# Patient Record
Sex: Female | Born: 1960 | Race: Black or African American | Hispanic: No | Smoking: Never smoker
Health system: Southern US, Community
[De-identification: ages and names within clinical notes are randomized; demographics above are authoritative.]

## PROBLEM LIST (undated history)

## (undated) DIAGNOSIS — A6000 Herpesviral infection of urogenital system, unspecified: Secondary | ICD-10-CM

## (undated) DIAGNOSIS — B019 Varicella without complication: Secondary | ICD-10-CM

## (undated) HISTORY — DX: Herpesviral infection of urogenital system, unspecified: A60.00

## (undated) HISTORY — DX: Varicella without complication: B01.9

## (undated) HISTORY — PX: GASTRIC BYPASS: SHX52

---

## 1999-08-23 ENCOUNTER — Other Ambulatory Visit: Admission: RE | Admit: 1999-08-23 | Discharge: 1999-08-23 | Payer: Self-pay | Admitting: Family Medicine

## 2000-09-18 ENCOUNTER — Other Ambulatory Visit: Admission: RE | Admit: 2000-09-18 | Discharge: 2000-09-18 | Payer: Self-pay | Admitting: Family Medicine

## 2001-10-02 ENCOUNTER — Other Ambulatory Visit: Admission: RE | Admit: 2001-10-02 | Discharge: 2001-10-02 | Payer: Self-pay | Admitting: Family Medicine

## 2002-03-21 ENCOUNTER — Encounter: Admission: RE | Admit: 2002-03-21 | Discharge: 2002-03-21 | Payer: Self-pay | Admitting: Family Medicine

## 2002-03-21 ENCOUNTER — Encounter: Payer: Self-pay | Admitting: Family Medicine

## 2004-06-09 ENCOUNTER — Ambulatory Visit: Payer: Self-pay | Admitting: Family Medicine

## 2004-06-09 ENCOUNTER — Other Ambulatory Visit: Admission: RE | Admit: 2004-06-09 | Discharge: 2004-06-09 | Payer: Self-pay | Admitting: Family Medicine

## 2004-06-25 ENCOUNTER — Ambulatory Visit: Payer: Self-pay | Admitting: Family Medicine

## 2004-07-02 ENCOUNTER — Encounter: Admission: RE | Admit: 2004-07-02 | Discharge: 2004-07-02 | Payer: Self-pay | Admitting: Family Medicine

## 2004-11-12 ENCOUNTER — Ambulatory Visit: Payer: Self-pay | Admitting: Cardiology

## 2004-11-24 ENCOUNTER — Ambulatory Visit: Payer: Self-pay

## 2004-11-29 ENCOUNTER — Ambulatory Visit: Payer: Self-pay

## 2005-07-11 ENCOUNTER — Ambulatory Visit: Payer: Self-pay | Admitting: Family Medicine

## 2005-11-02 ENCOUNTER — Encounter: Payer: Self-pay | Admitting: Family Medicine

## 2005-11-02 ENCOUNTER — Other Ambulatory Visit: Admission: RE | Admit: 2005-11-02 | Discharge: 2005-11-02 | Payer: Self-pay | Admitting: Family Medicine

## 2005-11-02 ENCOUNTER — Ambulatory Visit: Payer: Self-pay | Admitting: Family Medicine

## 2005-11-02 LAB — CONVERTED CEMR LAB: Pap Smear: NORMAL

## 2006-03-06 ENCOUNTER — Ambulatory Visit: Payer: Self-pay | Admitting: Family Medicine

## 2007-01-18 ENCOUNTER — Encounter: Payer: Self-pay | Admitting: Family Medicine

## 2007-01-18 DIAGNOSIS — E669 Obesity, unspecified: Secondary | ICD-10-CM

## 2007-01-18 DIAGNOSIS — Z8639 Personal history of other endocrine, nutritional and metabolic disease: Secondary | ICD-10-CM

## 2007-01-18 DIAGNOSIS — E049 Nontoxic goiter, unspecified: Secondary | ICD-10-CM | POA: Insufficient documentation

## 2007-01-18 DIAGNOSIS — T7840XA Allergy, unspecified, initial encounter: Secondary | ICD-10-CM | POA: Insufficient documentation

## 2007-01-18 DIAGNOSIS — Z862 Personal history of diseases of the blood and blood-forming organs and certain disorders involving the immune mechanism: Secondary | ICD-10-CM

## 2007-03-13 ENCOUNTER — Ambulatory Visit: Payer: Self-pay | Admitting: Family Medicine

## 2007-03-13 DIAGNOSIS — R5383 Other fatigue: Secondary | ICD-10-CM

## 2007-03-13 DIAGNOSIS — R5381 Other malaise: Secondary | ICD-10-CM

## 2007-03-13 DIAGNOSIS — N912 Amenorrhea, unspecified: Secondary | ICD-10-CM

## 2007-03-13 DIAGNOSIS — R4589 Other symptoms and signs involving emotional state: Secondary | ICD-10-CM

## 2007-03-14 ENCOUNTER — Encounter: Payer: Self-pay | Admitting: Family Medicine

## 2007-03-15 LAB — CONVERTED CEMR LAB
Calcium, Total (PTH): 8.7 mg/dL (ref 8.4–10.5)
PTH: 155.4 pg/mL — ABNORMAL HIGH (ref 14.0–72.0)

## 2007-03-28 ENCOUNTER — Ambulatory Visit: Payer: Self-pay | Admitting: Family Medicine

## 2007-03-28 DIAGNOSIS — E538 Deficiency of other specified B group vitamins: Secondary | ICD-10-CM | POA: Insufficient documentation

## 2007-04-26 ENCOUNTER — Other Ambulatory Visit: Admission: RE | Admit: 2007-04-26 | Discharge: 2007-04-26 | Payer: Self-pay | Admitting: Family Medicine

## 2007-04-26 ENCOUNTER — Ambulatory Visit: Payer: Self-pay | Admitting: Family Medicine

## 2007-04-26 ENCOUNTER — Encounter: Payer: Self-pay | Admitting: Family Medicine

## 2007-04-27 ENCOUNTER — Encounter: Payer: Self-pay | Admitting: Family Medicine

## 2007-04-30 LAB — CONVERTED CEMR LAB
Cholesterol: 177 mg/dL (ref 0–200)
HDL: 63.7 mg/dL (ref >39.0)
LDL Cholesterol: 104 mg/dL — ABNORMAL HIGH (ref 0–99)
Total CHOL/HDL Ratio: 2.8
Triglycerides: 49 mg/dL (ref 0–149)
VLDL: 10 mg/dL (ref 0–40)
hCG, Beta Chain, Quant, S: <0.5 milliintl units/mL

## 2007-05-02 ENCOUNTER — Encounter (INDEPENDENT_AMBULATORY_CARE_PROVIDER_SITE_OTHER): Payer: Self-pay | Admitting: *Deleted

## 2007-05-02 LAB — CONVERTED CEMR LAB: Pap Smear: NORMAL

## 2007-05-28 ENCOUNTER — Ambulatory Visit: Payer: Self-pay | Admitting: Family Medicine

## 2007-07-18 ENCOUNTER — Ambulatory Visit: Payer: Self-pay | Admitting: Family Medicine

## 2010-07-18 LAB — CONVERTED CEMR LAB
Albumin: 3.2 g/dL — ABNORMAL LOW (ref 3.5–5.2)
BUN: 4 mg/dL — ABNORMAL LOW (ref 6–23)
Basophils Absolute: 0 10*3/uL (ref 0.0–0.1)
Basophils Relative: 0.2 % (ref 0.0–1.0)
CO2: 30 meq/L (ref 19–32)
Calcium: 8.6 mg/dL (ref 8.4–10.5)
Chloride: 107 meq/L (ref 96–112)
Creatinine, Ser: 0.7 mg/dL (ref 0.4–1.2)
Eosinophils Absolute: 0 10*3/uL (ref 0.0–0.6)
Eosinophils Relative: 0.7 % (ref 0.0–5.0)
Folate: 13.3 ng/mL
GFR calc Af Amer: 116 mL/min
GFR calc non Af Amer: 96 mL/min
Glucose, Bld: 89 mg/dL (ref 70–99)
HCT: 39.4 % (ref 36.0–46.0)
Hemoglobin: 13.4 g/dL (ref 12.0–15.0)
Lymphocytes Relative: 29.9 % (ref 12.0–46.0)
MCHC: 34 g/dL (ref 30.0–36.0)
MCV: 84.4 fL (ref 78.0–100.0)
Monocytes Absolute: 0.5 10*3/uL (ref 0.2–0.7)
Monocytes Relative: 9.5 % (ref 3.0–11.0)
Neutro Abs: 3.3 10*3/uL (ref 1.4–7.7)
Neutrophils Relative %: 59.7 % (ref 43.0–77.0)
Phosphorus: 3.4 mg/dL (ref 2.3–4.6)
Platelets: 333 10*3/uL (ref 150–400)
Potassium: 3.8 meq/L (ref 3.5–5.1)
RBC: 4.67 M/uL (ref 3.87–5.11)
RDW: 12.6 % (ref 11.5–14.6)
Sodium: 141 meq/L (ref 135–145)
TSH: 4.22 microintl units/mL (ref 0.35–5.50)
Vitamin B-12: 108 pg/mL — ABNORMAL LOW (ref 211–911)
WBC: 5.4 10*3/uL (ref 4.5–10.5)
hCG, Beta Chain, Quant, S: <0.5 milliintl units/mL

## 2014-07-03 ENCOUNTER — Ambulatory Visit (INDEPENDENT_AMBULATORY_CARE_PROVIDER_SITE_OTHER): Payer: BLUE CROSS/BLUE SHIELD | Admitting: Internal Medicine

## 2014-07-03 ENCOUNTER — Encounter: Payer: Self-pay | Admitting: Internal Medicine

## 2014-07-03 ENCOUNTER — Encounter (INDEPENDENT_AMBULATORY_CARE_PROVIDER_SITE_OTHER): Payer: Self-pay

## 2014-07-03 NOTE — Progress Notes (Signed)
HPI  Pt presents to the clinic today to establish care. She used to see Dr. Milinda Antisower here in the past. She has been getting care recently from her gyn Dr. Normand Sloopillard.  Flu: 03/2014 Tetanus: 2007 LMP: post menopausal Pap Smear: 06/2013 Mammogram: 06/2013 Colon Screening: 2015 Vision Screening: as needed Dentist: as needed  Obesity: s/p gastric bypass. She does not adhere to any specific diet or exercise plan.   She has noticed some lower extremity swelling. It seems to be worse at the end of the day. She does not notice any swelling. It is not painful. She does sit in a chair all day at work. She has not tried anything OTC.   Past Medical History  Diagnosis Date  . Chicken pox   . Genital herpes     Current Outpatient Prescriptions  Medication Sig Dispense Refill  . acetaminophen (TYLENOL) 325 MG tablet Take 650 mg by mouth as needed.    . Misc Natural Products (OSTEO BI-FLEX JOINT SHIELD PO) Take 1 capsule by mouth daily.     No current facility-administered medications for this visit.    No Known Allergies  Family History  Problem Relation Age of Onset  . Cancer Father     Prostate  . Cancer Sister     Breast  . Hypertension Maternal Aunt   . Cancer Paternal Aunt     Breast    History   Social History  . Marital Status: Divorced    Spouse Name: N/A    Number of Children: N/A  . Years of Education: N/A   Occupational History  . Not on file.   Social History Main Topics  . Smoking status: Never Smoker   . Smokeless tobacco: Never Used  . Alcohol Use: 0.0 oz/week    0 Not specified per week     Comment: wine--occasional  . Drug Use: Not on file  . Sexual Activity: Not on file   Other Topics Concern  . Not on file   Social History Narrative  . No narrative on file    ROS:  Constitutional: Denies fever, malaise, fatigue, headache or abrupt weight changes.  Respiratory: Denies difficulty breathing, shortness of breath, cough or sputum production.    Cardiovascular: Pt reports swelling in the feet. Denies chest pain, chest tightness, palpitations or swelling in the hands.  Skin: Denies redness, rashes, lesions or ulcercations.  Neurological: Denies dizziness, difficulty with memory, difficulty with speech or problems with balance and coordination.   No other specific complaints in a complete review of systems (except as listed in HPI above).  PE:  BP 122/84 mmHg  Pulse 64  Temp(Src) 97.9 F (36.6 C) (Oral)  Ht 5' 3.88" (1.623 m)  Wt 286 lb (129.729 kg)  BMI 49.25 kg/m2  SpO2 98% Wt Readings from Last 3 Encounters:  07/03/14 286 lb (129.729 kg)  04/26/07 267 lb (121.11 kg)  03/13/07 265 lb (120.203 kg)    General: Appears her stated age, obese in NAD. Cardiovascular: Normal rate and rhythm. S1,S2 noted.  No murmur, rubs or gallops noted. 1+ pitting BLE edema. No carotid bruits noted. Pulmonary/Chest: Normal effort and positive vesicular breath sounds. No respiratory distress. No wheezes, rales or ronchi noted.  Neurological: Alert and oriented.  BMET    Component Value Date/Time   NA 141 03/13/2007 0000   K 3.8 03/13/2007 0000   CL 107 03/13/2007 0000   CO2 30 03/13/2007 0000   GLUCOSE 89 03/13/2007 0000   BUN 4* 03/13/2007  0000   CREATININE 0.7 03/13/2007 0000   CALCIUM 8.7 03/14/2007 0207   CALCIUM 8.6 03/13/2007 0000   GFRNONAA 96 03/13/2007 0000   GFRAA 116 03/13/2007 0000    Lipid Panel     Component Value Date/Time   CHOL 177 04/26/2007 1027   TRIG 49 04/26/2007 1027   HDL 63.7 04/26/2007 1027   CHOLHDL 2.8 CALC 04/26/2007 1027   VLDL 10 04/26/2007 1027   LDLCALC 104* 04/26/2007 1027    CBC    Component Value Date/Time   WBC 5.4 03/13/2007 0000   RBC 4.67 03/13/2007 0000   HGB 13.4 03/13/2007 0000   HCT 39.4 03/13/2007 0000   PLT 333 03/13/2007 0000   MCV 84.4 03/13/2007 0000   MCHC 34.0 03/13/2007 0000   RDW 12.6 03/13/2007 0000   MONOABS 0.5 03/13/2007 0000   EOSABS 0.0 03/13/2007 0000    BASOSABS 0.0 03/13/2007 0000    Hgb A1C No results found for: HGBA1C   Assessment and Plan:  Peripheral Edema:  Likely dependant Advise her to wear some compression stocking during the day Try to keep feet elevated while sitting No need for diuretic at this time  RTC when available for your physical exam

## 2014-07-03 NOTE — Assessment & Plan Note (Signed)
Advised her to work on diet and exercise

## 2014-07-03 NOTE — Progress Notes (Signed)
Pre visit review using our clinic review tool, if applicable. No additional management support is needed unless otherwise documented below in the visit note. 

## 2014-07-03 NOTE — Patient Instructions (Signed)
Peripheral Edema °You have swelling in your legs (peripheral edema). This swelling is due to excess accumulation of salt and water in your body. Edema may be a sign of heart, kidney or liver disease, or a side effect of a medication. It may also be due to problems in the leg veins. Elevating your legs and using special support stockings may be very helpful, if the cause of the swelling is due to poor venous circulation. Avoid long periods of standing, whatever the cause. °Treatment of edema depends on identifying the cause. Chips, pretzels, pickles and other salty foods should be avoided. Restricting salt in your diet is almost always needed. Water pills (diuretics) are often used to remove the excess salt and water from your body via urine. These medicines prevent the kidney from reabsorbing sodium. This increases urine flow. °Diuretic treatment may also result in lowering of potassium levels in your body. Potassium supplements may be needed if you have to use diuretics daily. Daily weights can help you keep track of your progress in clearing your edema. You should call your caregiver for follow up care as recommended. °SEEK IMMEDIATE MEDICAL CARE IF:  °· You have increased swelling, pain, redness, or heat in your legs. °· You develop shortness of breath, especially when lying down. °· You develop chest or abdominal pain, weakness, or fainting. °· You have a fever. °Document Released: 07/14/2004 Document Revised: 08/29/2011 Document Reviewed: 06/24/2009 °ExitCare® Patient Information ©2015 [employer]. This information is not intended to replace advice given to you by your health care provider. Make sure you discuss any questions you have with your health care provider. ° °

## 2014-09-09 ENCOUNTER — Ambulatory Visit (INDEPENDENT_AMBULATORY_CARE_PROVIDER_SITE_OTHER): Payer: BLUE CROSS/BLUE SHIELD | Admitting: Internal Medicine

## 2014-09-09 ENCOUNTER — Encounter: Payer: Self-pay | Admitting: Internal Medicine

## 2014-09-09 ENCOUNTER — Ambulatory Visit
Admission: RE | Admit: 2014-09-09 | Discharge: 2014-09-09 | Disposition: A | Discharge status: Home / Self Care | Payer: BLUE CROSS/BLUE SHIELD | Source: Ambulatory Visit | Attending: Internal Medicine | Admitting: Internal Medicine

## 2014-09-09 ENCOUNTER — Ambulatory Visit (INDEPENDENT_AMBULATORY_CARE_PROVIDER_SITE_OTHER)
Admission: RE | Admit: 2014-09-09 | Discharge: 2014-09-09 | Disposition: A | Discharge status: Home / Self Care | Payer: BLUE CROSS/BLUE SHIELD | Source: Ambulatory Visit | Attending: Internal Medicine | Admitting: Internal Medicine

## 2014-09-09 VITALS — BP 120/84 | HR 69 | Temp 97.8°F | Ht 63.88 in | Wt 286.8 lb

## 2014-09-09 DIAGNOSIS — M25561 Pain in right knee: Secondary | ICD-10-CM

## 2014-09-09 DIAGNOSIS — A6000 Herpesviral infection of urogenital system, unspecified: Secondary | ICD-10-CM | POA: Diagnosis not present

## 2014-09-09 DIAGNOSIS — H6123 Impacted cerumen, bilateral: Secondary | ICD-10-CM

## 2014-09-09 DIAGNOSIS — Z0000 Encounter for general adult medical examination without abnormal findings: Secondary | ICD-10-CM

## 2014-09-09 DIAGNOSIS — D509 Iron deficiency anemia, unspecified: Secondary | ICD-10-CM

## 2014-09-09 DIAGNOSIS — M25562 Pain in left knee: Principal | ICD-10-CM

## 2014-09-09 LAB — CBC
HCT: 40.6 % (ref 36.0–46.0)
Hemoglobin: 13.4 g/dL (ref 12.0–15.0)
MCHC: 33.2 g/dL (ref 30.0–36.0)
MCV: 84.3 fl (ref 78.0–100.0)
Platelets: 318 10*3/uL (ref 150.0–400.0)
RBC: 4.82 Mil/uL (ref 3.87–5.11)
RDW: 14.4 % (ref 11.5–15.5)
WBC: 4 10*3/uL (ref 4.0–10.5)

## 2014-09-09 LAB — COMPREHENSIVE METABOLIC PANEL
ALT: 8 U/L (ref 0–35)
AST: 18 U/L (ref 0–37)
Albumin: 3.5 g/dL (ref 3.5–5.2)
Alkaline Phosphatase: 96 U/L (ref 39–117)
BILIRUBIN TOTAL: 0.4 mg/dL (ref 0.2–1.2)
BUN: 10 mg/dL (ref 6–23)
CALCIUM: 8.9 mg/dL (ref 8.4–10.5)
CHLORIDE: 106 meq/L (ref 96–112)
CO2: 30 meq/L (ref 19–32)
CREATININE: 0.78 mg/dL (ref 0.40–1.20)
GFR: 99.02 mL/min (ref >60.00)
GLUCOSE: 92 mg/dL (ref 70–99)
Potassium: 3.9 mEq/L (ref 3.5–5.1)
Sodium: 140 mEq/L (ref 135–145)
Total Protein: 6.9 g/dL (ref 6.0–8.3)

## 2014-09-09 LAB — LIPID PANEL
Cholesterol: 168 mg/dL (ref 0–200)
HDL: 62 mg/dL (ref >39.00)
LDL Cholesterol: 97 mg/dL (ref 0–99)
NonHDL: 106
Total CHOL/HDL Ratio: 3
Triglycerides: 46 mg/dL (ref 0.0–149.0)
VLDL: 9.2 mg/dL (ref 0.0–40.0)

## 2014-09-09 LAB — TSH: TSH: 3.3 u[IU]/mL (ref 0.35–4.50)

## 2014-09-09 LAB — IRON: Iron: 104 ug/dL (ref 42–145)

## 2014-09-09 LAB — VITAMIN D 25 HYDROXY (VIT D DEFICIENCY, FRACTURES): VITD: 13.63 ng/mL — ABNORMAL LOW (ref 30.00–100.00)

## 2014-09-09 LAB — FERRITIN: FERRITIN: 27.9 ng/mL (ref 10.0–291.0)

## 2014-09-09 MED ORDER — VALACYCLOVIR HCL 500 MG PO TABS: 500.0000 mg | ORAL_TABLET | Freq: Two times a day (BID) | ORAL | Status: DC

## 2014-09-09 NOTE — Patient Instructions (Signed)

## 2014-09-09 NOTE — Progress Notes (Signed)
Pre visit review using our clinic review tool, if applicable. No additional management support is needed unless otherwise documented below in the visit note. 

## 2014-09-09 NOTE — Assessment & Plan Note (Signed)
Will check iron and ferritin levels today Continue OTC iron supplement and stool softener at this time

## 2014-09-09 NOTE — Addendum Note (Signed)
Addended by: Lorre MunroeBAITY, REGINA W on: 09/09/2014 09:54 AM   Modules accepted: Orders, SmartSet

## 2014-09-09 NOTE — Progress Notes (Addendum)
Subjective:    Patient ID: Valerie BillingsLisa F Kimmet, female    DOB: 1960/08/06, [age] y.o.   MRN: [id number]  HPI  Pt presents to the clinic today for her annual exam.  Flu: 03/2014 Tetanus: 2007 LMP: post menopausal Pap Smear: 06/2013 Mammogram: 06/2013 Colon Screening: 2015 Vision Screening: as needed Dentist: as needed  She does report bilateral knee pain. She reports this started about 6 months ago. She reports they feel stiff at times. She has not noticed any swelling. She has noticed some cracking and popping. She denies any injury to the area. She take Tylenol as needed for the pain.  She also reports history of genital herpes. She will occasionally have 1 single lesion that pops up on her labia. She is out of her Valtrex and does request a refill today.  She has a history of iron deficiency anemia. She has not had her iron levels or iron stores checked in a long time. She is taking an iron supplement with a stool softener daily. She has not noticed any s/s of bleeding. She did have a colonoscopy last year, which was normal.  She also reports that the edema in her legs is gone. Review of Systems  Past Medical History  Diagnosis Date  . Chicken pox   . Genital herpes     Current Outpatient Prescriptions  Medication Sig Dispense Refill  . acetaminophen (TYLENOL) 325 MG tablet Take 650 mg by mouth as needed.    . Misc Natural Products (OSTEO BI-FLEX JOINT SHIELD PO) Take 1 capsule by mouth daily.     No current facility-administered medications for this visit.    No Known Allergies  Family History  Problem Relation Age of Onset  . Cancer Father     Prostate  . Cancer Sister     Breast  . Hypertension Maternal Aunt   . Cancer Paternal Aunt     Breast    History   Social History  . Marital Status: Divorced    Spouse Name: N/A  . Number of Children: N/A  . Years of Education: N/A   Occupational History  . Not on file.   Social History Main Topics  . Smoking  status: Never Smoker   . Smokeless tobacco: Never Used  . Alcohol Use: 0.0 oz/week    0 Standard drinks or equivalent per week     Comment: wine--occasional  . Drug Use: No  . Sexual Activity: Not Currently   Other Topics Concern  . Not on file   Social History Narrative  . No narrative on file     Constitutional: Denies fever, malaise, fatigue, headache or abrupt weight changes.  HEENT: Denies eye pain, eye redness, ear pain, ringing in the ears, wax buildup, runny nose, nasal congestion, bloody nose, or sore throat. Respiratory: Denies difficulty breathing, shortness of breath, cough or sputum production.   Cardiovascular: Denies chest pain, chest tightness, palpitations or swelling in the hands or feet.  Gastrointestinal: Denies abdominal pain, bloating, constipation, diarrhea or blood in the stool.  GU: Denies urgency, frequency, pain with urination, burning sensation, blood in urine, odor or discharge. Musculoskeletal: Pt reports knee pain. Denies decrease in range of motion, difficulty with gait, muscle pain or joint swelling.  Skin: Denies redness, rashes, lesions or ulcercations.  Neurological: Denies dizziness, difficulty with memory, difficulty with speech or problems with balance and coordination.  Psych: Denies anxiety, depression, SI/HI.  No other specific complaints in a complete review of systems (except as listed  in HPI above).     Objective:   Physical Exam  BP 120/84 mmHg  Pulse 69  Temp(Src) 97.8 F (36.6 C) (Oral)  Ht 5' 3.88" (1.623 m)  Wt 286 lb 12 oz (130.069 kg)  BMI 49.38 kg/m2  SpO2 97% Wt Readings from Last 3 Encounters:  09/09/14 286 lb 12 oz (130.069 kg)  07/03/14 286 lb (129.729 kg)  04/26/07 267 lb (121.11 kg)    General: Appears her stated age,obese in NAD. Skin: Warm, dry and intact. No rashes, lesions or ulcerations noted. HEENT: Head: normal shape and size; Eyes: sclera white, no icterus, conjunctiva pink, PERRLA and EOMs intact;  Ears: bilateral cerumen impaction; Nose: mucosa pink and moist, septum midline; Throat/Mouth: Teeth present, mucosa pink and moist, no exudate, lesions or ulcerations noted.  Neck: Neck supple, trachea midline. No masses, lumps or thyromegaly present.  Cardiovascular: Normal rate and rhythm. S1,S2 noted.  No murmur, rubs or gallops noted. No JVD or BLE edema. No carotid bruits noted. Pulmonary/Chest: Normal effort and positive vesicular breath sounds. No respiratory distress. No wheezes, rales or ronchi noted.  Abdomen: Soft and nontender. Normal bowel sounds, no bruits noted. No distention or masses noted. Liver, spleen and kidneys non palpable. Musculoskeletal: Normal flexion and extension of bilateral knees. No pain with palpation. No signs of joint swelling. Strength 5/5 BLE. No difficulty with gait.  Neurological: Alert and oriented. Cranial nerves II-XII grossly intact. Coordination normal. Psychiatric: Mood and affect normal. Behavior is normal. Judgment and thought content normal.     BMET    Component Value Date/Time   NA 141 03/13/2007 0000   K 3.8 03/13/2007 0000   CL 107 03/13/2007 0000   CO2 30 03/13/2007 0000   GLUCOSE 89 03/13/2007 0000   BUN 4* 03/13/2007 0000   CREATININE 0.7 03/13/2007 0000   CALCIUM 8.7 03/14/2007 0207   CALCIUM 8.6 03/13/2007 0000   GFRNONAA 96 03/13/2007 0000   GFRAA 116 03/13/2007 0000    Lipid Panel     Component Value Date/Time   CHOL 177 04/26/2007 1027   TRIG 49 04/26/2007 1027   HDL 63.7 04/26/2007 1027   CHOLHDL 2.8 CALC 04/26/2007 1027   VLDL 10 04/26/2007 1027   LDLCALC 104* 04/26/2007 1027    CBC    Component Value Date/Time   WBC 5.4 03/13/2007 0000   RBC 4.67 03/13/2007 0000   HGB 13.4 03/13/2007 0000   HCT 39.4 03/13/2007 0000   PLT 333 03/13/2007 0000   MCV 84.4 03/13/2007 0000   MCHC 34.0 03/13/2007 0000   RDW 12.6 03/13/2007 0000   MONOABS 0.5 03/13/2007 0000   EOSABS 0.0 03/13/2007 0000   BASOSABS 0.0 03/13/2007  0000    Hgb A1C No results found for: HGBA1C       Assessment & Plan:   Preventative Health Maintenance:  She is past due for her mammogram- she will [facility] Encouraged her to visit and eye doctor and dentist at least annually Colon screening, pap smear, flu and tetanus UTD  Bilateral Cerumen Impaction:  Manual lavage by CMA Unsuccessful- will refer to ENT Advised her to use Debrox drops OTC as directed  Bilateral Knee Pain:  Likely exacerbated by weight Discussed benefits of weight loss on joint pains Will obtain xray of bilateral knees Continue Tylenol for now, may consider switching to Meloxicam  RTC in 1 year or sooner if needed

## 2014-09-09 NOTE — Assessment & Plan Note (Signed)
No current lesions Will give RX for Valtrex 500 mg BID x 3 days prn

## 2014-09-10 ENCOUNTER — Other Ambulatory Visit: Payer: Self-pay | Admitting: Internal Medicine

## 2014-09-10 MED ORDER — MELOXICAM 15 MG PO TABS: 15.0000 mg | ORAL_TABLET | Freq: Every day | ORAL | Status: DC

## 2014-09-10 MED ORDER — VITAMIN D (ERGOCALCIFEROL) 1.25 MG (50000 UNIT) PO CAPS: ORAL_CAPSULE | ORAL | Status: DC

## 2014-09-10 NOTE — Addendum Note (Signed)
Addended by: Baldomero LamyHAVERS, Kamal Jurgens C on: 09/10/2014 03:36 PM   Modules accepted: Kipp BroodSmartSet

## 2014-10-03 ENCOUNTER — Telehealth: Payer: Self-pay

## 2014-10-03 NOTE — Telephone Encounter (Signed)
Pt noticed on meloxicam bottle that she had 2 refills until next year; pt said she takes med every day and 2 refills will not last until next year. I advised pt that the provider puts the # of refills but the pharmacy has guidelines set up when those rx will expire. Advised pt when she needs refills to contact pharmacy and they will notify provider that additional refills are needed. Pt voiced understanding.

## 2014-12-09 ENCOUNTER — Telehealth: Payer: Self-pay

## 2014-12-09 DIAGNOSIS — E559 Vitamin D deficiency, unspecified: Secondary | ICD-10-CM

## 2014-12-09 NOTE — Telephone Encounter (Signed)
Pt left v/m; pt started Vit D 50,000 U weekly for 12 weeks. Pharmacy has been contacting pt about refill of Vit D 50,000 U. Pt request cb if Nicki Reaper NP wants pt to continue taking the Vit D 50,000 U.

## 2014-12-09 NOTE — Telephone Encounter (Signed)
She needs to have her Vit D rechecked. Please order future lab if it is not already ordered.

## 2014-12-09 NOTE — Telephone Encounter (Signed)
Pt is aware lab only appt made and lab ordered

## 2014-12-09 NOTE — Addendum Note (Signed)
Addended by: Roena Malady on: 12/09/2014 01:48 PM   Modules accepted: Orders

## 2014-12-10 ENCOUNTER — Other Ambulatory Visit (INDEPENDENT_AMBULATORY_CARE_PROVIDER_SITE_OTHER): Payer: BLUE CROSS/BLUE SHIELD

## 2014-12-10 ENCOUNTER — Other Ambulatory Visit: Payer: Self-pay | Admitting: Internal Medicine

## 2014-12-10 DIAGNOSIS — E559 Vitamin D deficiency, unspecified: Secondary | ICD-10-CM | POA: Diagnosis not present

## 2014-12-11 LAB — VITAMIN D 25 HYDROXY (VIT D DEFICIENCY, FRACTURES): VITD: 21.68 ng/mL — AB (ref 30.00–100.00)

## 2014-12-12 MED ORDER — VITAMIN D (ERGOCALCIFEROL) 1.25 MG (50000 UNIT) PO CAPS: ORAL_CAPSULE | ORAL | Status: DC

## 2014-12-12 NOTE — Addendum Note (Signed)
Addended by: Roena Malady on: 12/12/2014 11:53 AM   Modules accepted: Orders

## 2015-01-15 ENCOUNTER — Encounter: Payer: Self-pay | Admitting: *Deleted

## 2015-02-09 ENCOUNTER — Other Ambulatory Visit: Payer: Self-pay | Admitting: Podiatry

## 2015-02-09 ENCOUNTER — Telehealth: Payer: Self-pay | Admitting: Internal Medicine

## 2015-02-09 NOTE — Telephone Encounter (Signed)
Pt came in today wanting to know if you would do lab work for [facility] Dr Elvin So  [phone] in [place] He gave her a rx and wanted to have her LFT Every 4 weeks starting 03/05/15 for 3 months Fax results to Dr Elvin So [phone]

## 2015-02-10 NOTE — Telephone Encounter (Signed)
That is fine, we can draw labs for outside providers.

## 2015-02-10 NOTE — Telephone Encounter (Signed)
Spoke to [place] and confirmed ok to draw. States she was unaware whether we drew outside labs, and if so, it was ok. Spoke to Valerie Jacobs and advised, that, unfortunately [facility] does NOT draw outside labs and she will need to have them drawn at the ordering site or [facility]

## 2015-03-03 ENCOUNTER — Other Ambulatory Visit: Payer: Self-pay | Admitting: Internal Medicine

## 2015-03-03 DIAGNOSIS — E559 Vitamin D deficiency, unspecified: Secondary | ICD-10-CM

## 2015-03-04 ENCOUNTER — Other Ambulatory Visit (INDEPENDENT_AMBULATORY_CARE_PROVIDER_SITE_OTHER): Payer: BLUE CROSS/BLUE SHIELD

## 2015-03-04 DIAGNOSIS — E559 Vitamin D deficiency, unspecified: Secondary | ICD-10-CM | POA: Diagnosis not present

## 2015-03-05 LAB — VITAMIN D 25 HYDROXY (VIT D DEFICIENCY, FRACTURES): VITD: 27.39 ng/mL — AB (ref 30.00–100.00)

## 2015-03-06 MED ORDER — VITAMIN D (ERGOCALCIFEROL) 1.25 MG (50000 UNIT) PO CAPS: ORAL_CAPSULE | ORAL | Status: DC

## 2015-03-06 NOTE — Addendum Note (Signed)
Addended by: Roena Malady on: 03/06/2015 05:03 PM   Modules accepted: Orders

## 2015-03-11 ENCOUNTER — Encounter: Payer: Self-pay | Admitting: Internal Medicine

## 2015-03-16 ENCOUNTER — Other Ambulatory Visit: Payer: Self-pay | Admitting: Internal Medicine

## 2015-08-20 ENCOUNTER — Other Ambulatory Visit: Payer: Self-pay | Admitting: Internal Medicine

## 2015-09-17 ENCOUNTER — Encounter: Payer: BLUE CROSS/BLUE SHIELD | Admitting: Internal Medicine

## 2015-10-22 ENCOUNTER — Encounter: Payer: Self-pay | Admitting: Internal Medicine

## 2015-10-22 ENCOUNTER — Ambulatory Visit (INDEPENDENT_AMBULATORY_CARE_PROVIDER_SITE_OTHER): Payer: BLUE CROSS/BLUE SHIELD | Admitting: Internal Medicine

## 2015-10-22 VITALS — BP 118/82 | HR 81 | Temp 98.4°F | Ht 64.0 in | Wt 269.5 lb

## 2015-10-22 DIAGNOSIS — M25562 Pain in left knee: Secondary | ICD-10-CM

## 2015-10-22 DIAGNOSIS — A6000 Herpesviral infection of urogenital system, unspecified: Secondary | ICD-10-CM

## 2015-10-22 DIAGNOSIS — D509 Iron deficiency anemia, unspecified: Secondary | ICD-10-CM | POA: Diagnosis not present

## 2015-10-22 DIAGNOSIS — M25561 Pain in right knee: Secondary | ICD-10-CM

## 2015-10-22 DIAGNOSIS — E559 Vitamin D deficiency, unspecified: Secondary | ICD-10-CM | POA: Insufficient documentation

## 2015-10-22 DIAGNOSIS — Z23 Encounter for immunization: Secondary | ICD-10-CM

## 2015-10-22 DIAGNOSIS — Z0000 Encounter for general adult medical examination without abnormal findings: Secondary | ICD-10-CM

## 2015-10-22 LAB — CBC WITH DIFFERENTIAL/PLATELET
Basophils Absolute: 0 10*3/uL (ref 0.0–0.1)
Basophils Relative: 0.3 % (ref 0.0–3.0)
EOS PCT: 1.7 % (ref 0.0–5.0)
Eosinophils Absolute: 0.1 10*3/uL (ref 0.0–0.7)
HEMATOCRIT: 41.8 % (ref 36.0–46.0)
HEMOGLOBIN: 13.4 g/dL (ref 12.0–15.0)
LYMPHS ABS: 2 10*3/uL (ref 0.7–4.0)
LYMPHS PCT: 27.1 % (ref 12.0–46.0)
MCHC: 32.2 g/dL (ref 30.0–36.0)
MCV: 87.6 fl (ref 78.0–100.0)
MONOS PCT: 6.9 % (ref 3.0–12.0)
Monocytes Absolute: 0.5 10*3/uL (ref 0.1–1.0)
Neutro Abs: 4.8 10*3/uL (ref 1.4–7.7)
Neutrophils Relative %: 64 % (ref 43.0–77.0)
Platelets: 351 10*3/uL (ref 150.0–400.0)
RBC: 4.77 Mil/uL (ref 3.87–5.11)
RDW: 15.1 % (ref 11.5–15.5)
WBC: 7.5 10*3/uL (ref 4.0–10.5)

## 2015-10-22 LAB — COMPREHENSIVE METABOLIC PANEL
ALBUMIN: 3.9 g/dL (ref 3.5–5.2)
ALK PHOS: 83 U/L (ref 39–117)
ALT: 10 U/L (ref 0–35)
AST: 18 U/L (ref 0–37)
BUN: 15 mg/dL (ref 6–23)
CALCIUM: 9.4 mg/dL (ref 8.4–10.5)
CO2: 30 mEq/L (ref 19–32)
CREATININE: 0.77 mg/dL (ref 0.40–1.20)
Chloride: 102 mEq/L (ref 96–112)
GFR: 100.09 mL/min (ref >60.00)
Glucose, Bld: 94 mg/dL (ref 70–99)
Potassium: 4.1 mEq/L (ref 3.5–5.1)
Sodium: 139 mEq/L (ref 135–145)
Total Bilirubin: 0.4 mg/dL (ref 0.2–1.2)
Total Protein: 7.4 g/dL (ref 6.0–8.3)

## 2015-10-22 LAB — IBC PANEL
Iron: 37 ug/dL — ABNORMAL LOW (ref 42–145)
SATURATION RATIOS: 10.7 % — AB (ref 20.0–50.0)
Transferrin: 246 mg/dL (ref 212.0–360.0)

## 2015-10-22 LAB — LIPID PANEL
CHOLESTEROL: 181 mg/dL (ref 0–200)
HDL: 59 mg/dL (ref >39.00)
LDL CALC: 114 mg/dL — AB (ref 0–99)
NonHDL: 122.38
TRIGLYCERIDES: 42 mg/dL (ref 0.0–149.0)
Total CHOL/HDL Ratio: 3
VLDL: 8.4 mg/dL (ref 0.0–40.0)

## 2015-10-22 LAB — FERRITIN: Ferritin: 89.8 ng/mL (ref 10.0–291.0)

## 2015-10-22 LAB — VITAMIN D 25 HYDROXY (VIT D DEFICIENCY, FRACTURES): VITD: 31.34 ng/mL (ref 30.00–100.00)

## 2015-10-22 LAB — HEMOGLOBIN A1C: Hgb A1c MFr Bld: 5.5 % (ref 4.6–6.5)

## 2015-10-22 NOTE — Assessment & Plan Note (Addendum)
Check Vit D today. 

## 2015-10-22 NOTE — Assessment & Plan Note (Signed)
Will check CBC with diff, ferritin and IBC panel today Continue iron supplement and stool softener daily

## 2015-10-22 NOTE — Progress Notes (Signed)
Pre visit review using our clinic review tool, if applicable. No additional management support is needed unless otherwise documented below in the visit note. 

## 2015-10-22 NOTE — Progress Notes (Signed)
Subjective:    Patient ID: Valerie Jacobs, female    DOB: July 26, 1960, 55 y.o.   MRN: [id number]  HPI  Pt presents to the clinic today for her annual exam. She is also due for follow up of chronic conditions.  Flu: 03/2015 Tetanus: 2007 LMP: post menopausal Pap Smear: 06/2013- normal Mammogram: 02/2015 Colon Screening: 2015 (10 years) Vision Screening: yearly Dentist: biannually  Bilateral knee pain: Improved with weight loss. She does not need to take her Meloxicam daily.  She also reports history of genital herpes. She will occasionally have 1 single lesion that pops up on her labia. She takes Valtrex as needed.  She has a history of iron deficiency anemia. She is taking an iron supplement with a stool softener daily. She has not noticed any s/s of bleeding.   Diet: She does atemeat. She consumes fruits and veggies daily. She tries to avoid fried foods. She drinks mostly water. Exercise: She does 30-40 minutes of cardio 3 days a week. 30 minutes of weight training.   Review of Systems  Past Medical History  Diagnosis Date  . Chicken pox   . Genital herpes     Current Outpatient Prescriptions  Medication Sig Dispense Refill  . acetaminophen (TYLENOL) 325 MG tablet Take 650 mg by mouth as needed.    Tery Sanfilippo. Docusate Calcium (STOOL SOFTENER PO) Take 1 tablet by mouth at bedtime.    . Iron-Folic Acid-Vit B12 (IRON FORMULA PO) Take 2 tablets by mouth daily.    . meloxicam (MOBIC) 15 MG tablet TAKE 1 TABLET (15 MG TOTAL) BY MOUTH DAILY. 30 tablet 2  . Misc Natural Products (OSTEO BI-FLEX JOINT SHIELD PO) Take 1 capsule by mouth daily.    . valACYclovir (VALTREX) 500 MG tablet Take 1 tablet (500 mg total) by mouth 2 (two) times daily. 30 tablet 1  . Vitamin D, Ergocalciferol, (DRISDOL) 50000 UNITS CAPS capsule Take one capsule by mouth every 7 days for 12 weeks. 12 capsule 0   No current facility-administered medications for this visit.    No Known Allergies  Family History    Problem Relation Age of Onset  . Cancer Father     Prostate  . Cancer Sister     Breast  . Hypertension Maternal Aunt   . Cancer Paternal Aunt     Breast    Social History   Social History  . Marital Status: Divorced    Spouse Name: N/A  . Number of Children: N/A  . Years of Education: N/A   Occupational History  . Not on file.   Social History Main Topics  . Smoking status: Never Smoker   . Smokeless tobacco: Never Used  . Alcohol Use: 0.0 oz/week    0 Standard drinks or equivalent per week     Comment: wine--occasional  . Drug Use: No  . Sexual Activity: Not Currently   Other Topics Concern  . Not on file   Social History Narrative     Constitutional: Denies fever, malaise, fatigue, headache or abrupt weight changes.  HEENT: Denies eye pain, eye redness, ear pain, ringing in the ears, wax buildup, runny nose, nasal congestion, bloody nose, or sore throat. Respiratory: Denies difficulty breathing, shortness of breath, cough or sputum production.   Cardiovascular: Denies chest pain, chest tightness, palpitations or swelling in the hands or feet.  Gastrointestinal: Denies abdominal pain, bloating, constipation, diarrhea or blood in the stool.  GU: Denies urgency, frequency, pain with urination, burning sensation, blood in  urine, odor or discharge. Musculoskeletal: Pt reports occasional knee pain. Denies decrease in range of motion, difficulty with gait, muscle pain or joint swelling.  Skin: Denies redness, rashes, lesions or ulcercations.  Neurological: Denies dizziness, difficulty with memory, difficulty with speech or problems with balance and coordination.  Psych: Denies anxiety, depression, SI/HI.  No other specific complaints in a complete review of systems (except as listed in HPI above).     Objective:   Physical Exam BP 118/82 mmHg  Pulse 81  Temp(Src) 98.4 F (36.9 C) (Oral)  Ht  (1.626 m)  Wt 269 lb 8 oz (122.244 kg)  BMI 46.24 kg/m2  SpO2  99%  Wt Readings from Last 3 Encounters:  09/09/14 286 lb 12 oz (130.069 kg)  07/03/14 286 lb (129.729 kg)  04/26/07 267 lb (121.11 kg)    General: Appears her stated age, obese in NAD. Skin: Warm, dry and intact. HEENT: Head: normal shape and size; Eyes: sclera white, no icterus, conjunctiva pink, PERRLA and EOMs intact; Ears: bilateral cerumen impaction; Throat/Mouth: Teeth present, mucosa pink and moist, no exudate, lesions or ulcerations noted.  Neck: Neck supple, trachea midline. No masses, lumps or thyromegaly present.  Cardiovascular: Normal rate and rhythm. S1,S2 noted.  No murmur, rubs or gallops noted. No JVD or BLE edema. No carotid bruits noted. Pulmonary/Chest: Normal effort and positive vesicular breath sounds. No respiratory distress. No wheezes, rales or ronchi noted.  Abdomen: Soft and nontender. Normal bowel sounds. No distention or masses noted. Liver, spleen and kidneys non palpable. Musculoskeletal: Normal flexion and extension of bilateral knees. No pain with palpation. No signs of joint swelling. Strength 5/5 BLE. No difficulty with gait.  Neurological: Alert and oriented. Cranial nerves II-XII grossly intact. Coordination normal. Psychiatric: Mood and affect normal. Behavior is normal. Judgment and thought content normal.     BMET    Component Value Date/Time   NA 140 09/09/2014 0850   K 3.9 09/09/2014 0850   CL 106 09/09/2014 0850   CO2 30 09/09/2014 0850   GLUCOSE 92 09/09/2014 0850   BUN 10 09/09/2014 0850   CREATININE 0.78 09/09/2014 0850   CALCIUM 8.9 09/09/2014 0850   CALCIUM 8.7 03/14/2007 0207   GFRNONAA 96 03/13/2007 0000   GFRAA 116 03/13/2007 0000    Lipid Panel     Component Value Date/Time   CHOL 168 09/09/2014 0850   TRIG 46.0 09/09/2014 0850   HDL 62.00 09/09/2014 0850   CHOLHDL 3 09/09/2014 0850   VLDL 9.2 09/09/2014 0850   LDLCALC 97 09/09/2014 0850    CBC    Component Value Date/Time   WBC 4.0 09/09/2014 0850   RBC 4.82  09/09/2014 0850   HGB 13.4 09/09/2014 0850   HCT 40.6 09/09/2014 0850   PLT 318.0 09/09/2014 0850   MCV 84.3 09/09/2014 0850   MCHC 33.2 09/09/2014 0850   RDW 14.4 09/09/2014 0850   MONOABS 0.5 03/13/2007 0000   EOSABS 0.0 03/13/2007 0000   BASOSABS 0.0 03/13/2007 0000    Hgb A1C No results found for: HGBA1C       Assessment & Plan:   Preventative Health Maintenance:  Flu shot UTD Tdap today Mammogram due 02/2016- she will schedule on her own Pap Smear due 2020 Colonoscopy UTD Encouraged her to consume a balanced diet and continue exercise regimen Encouraged her to see an eye doctor and dentist at least annually Will check CBC, CMET, Lipid, A1C today   RTC in 1 year or sooner if needed

## 2015-10-22 NOTE — Assessment & Plan Note (Signed)
Continue Valtrex prn 

## 2015-10-22 NOTE — Patient Instructions (Signed)

## 2015-10-22 NOTE — Assessment & Plan Note (Signed)
Likely exacerbated by weight Discussed benefits of weight loss on joint pains Continue Meloxicam as needed

## 2015-11-26 ENCOUNTER — Ambulatory Visit (INDEPENDENT_AMBULATORY_CARE_PROVIDER_SITE_OTHER): Payer: Managed Care, Other (non HMO) | Admitting: Family Medicine

## 2015-11-26 ENCOUNTER — Encounter: Payer: Self-pay | Admitting: Family Medicine

## 2015-11-26 VITALS — BP 110/68 | HR 68 | Temp 98.3°F | Wt 269.5 lb

## 2015-11-26 DIAGNOSIS — T148 Other injury of unspecified body region: Secondary | ICD-10-CM | POA: Diagnosis not present

## 2015-11-26 DIAGNOSIS — W57XXXA Bitten or stung by nonvenomous insect and other nonvenomous arthropods, initial encounter: Secondary | ICD-10-CM | POA: Diagnosis not present

## 2015-11-26 NOTE — Patient Instructions (Signed)
Watch for new fever, rash, joint pains, abdominal pain or nausea, or headache over next 1 week. If any of these develop, let us know.   Tick Bite Information Ticks are insects that attach themselves to the skin and draw blood for food. There are various types of ticks. Common types include wood ticks and deer ticks. Most ticks live in shrubs and grassy areas. Ticks can climb onto your body when you make contact with leaves or grass where the tick is waiting. The most common places on the body for ticks to attach themselves are the scalp, neck, armpits, waist, and groin. Most tick bites are harmless, but sometimes ticks carry germs that cause diseases. These germs can be spread to a person during the tick's feeding process. The chance of a disease spreading through a tick bite depends on:   The type of tick.  Time of year.   How long the tick is attached.   Geographic location.  HOW CAN YOU PREVENT TICK BITES? Take these steps to help prevent tick bites when you are outdoors:  Wear protective clothing. Long sleeves and long pants are best.   Wear white clothes so you can see ticks more easily.  Tuck your pant legs into your socks.   If walking on a trail, stay in the middle of the trail to avoid brushing against bushes.  Avoid walking through areas with long grass.  Put insect repellent on all exposed skin and along boot tops, pant legs, and sleeve cuffs.   Check clothing, hair, and skin repeatedly and before going inside.   Brush off any ticks that are not attached.  Take a shower or bath as soon as possible after being outdoors.  WHAT IS THE PROPER WAY TO REMOVE A TICK? Ticks should be removed as soon as possible to help prevent diseases caused by tick bites. 1. If latex gloves are available, put them on before trying to remove a tick.  2. Using fine-point tweezers, grasp the tick as close to the skin as possible. You may also use curved forceps or a tick removal tool.  Grasp the tick as close to its head as possible. Avoid grasping the tick on its body. 3. Pull gently with steady upward pressure until the tick lets go. Do not twist the tick or jerk it suddenly. This may break off the tick's head or mouth parts. 4. Do not squeeze or crush the tick's body. This could force disease-carrying fluids from the tick into your body.  5. After the tick is removed, wash the bite area and your hands with soap and water or other disinfectant such as alcohol. 6. Apply a small amount of antiseptic cream or ointment to the bite site.  7. Wash and disinfect any instruments that were used.  Do not try to remove a tick by applying a hot match, petroleum jelly, or fingernail polish to the tick. These methods do not work and may increase the chances of disease being spread from the tick bite.  WHEN SHOULD YOU SEEK MEDICAL CARE? Contact your health care provider if you are unable to remove a tick from your skin or if a part of the tick breaks off and is stuck in the skin.  After a tick bite, you need to be aware of signs and symptoms that could be related to diseases spread by ticks. Contact your health care provider if you develop any of the following in the days or weeks after the tick bite:  Unexplained fever.  Rash. A circular rash that appears days or weeks after the tick bite may indicate the possibility of Lyme disease. The rash may resemble a target with a bull's-eye and may occur at a different part of your body than the tick bite.  Redness and swelling in the area of the tick bite.   Tender, swollen lymph glands.   Diarrhea.   Weight loss.   Cough.   Fatigue.   Muscle, joint, or bone pain.   Abdominal pain.   Headache.   Lethargy or a change in your level of consciousness.  Difficulty walking or moving your legs.   Numbness in the legs.   Paralysis.  Shortness of breath.   Confusion.   Repeated vomiting.    This information is  not intended to replace advice given to you by your health care provider. Make sure you discuss any questions you have with your health care provider.   Document Released: 06/03/2000 Document Revised: 06/27/2014 Document Reviewed: 11/14/2012 Elsevier Interactive Patient Education [employer]

## 2015-11-26 NOTE — Progress Notes (Signed)
Pre visit review using our clinic review tool, if applicable. No additional management support is needed unless otherwise documented below in the visit note. 

## 2015-11-26 NOTE — Progress Notes (Signed)
   BP 110/68 mmHg  Pulse 68  Temp(Src) 98.3 F (36.8 C) (Oral)  Wt 269 lb 8 oz (122.244 kg)  SpO2 99%   CC: check tick bite  Subjective:    Patient ID: Valerie Jacobs, female    DOB: [birth year], 55 y.o.   MRN: [id number]  HPI: Valerie Jacobs is a 55 y.o. female presenting on 11/26/2015 for Raised tick bite   Tick bite 11/10/2015 - 15 days ago. Removed fairly quickly. Thinks she got it all out. Area remained red and itchy for days, staying raised and itchy. Wants to get evaluated.   Denies fevers/chills, new rashes, headaches, joint pains, abd pain or nausea.   Relevant past medical, surgical, family and social history reviewed and updated as indicated. Interim medical history since our last visit reviewed. Allergies and medications reviewed and updated. Current Outpatient Prescriptions on File Prior to Visit  Medication Sig  . acetaminophen (TYLENOL) 325 MG tablet Take 650 mg by mouth as needed.  . Cholecalciferol (VITAMIN D) 2000 units CAPS Take 1 capsule by mouth daily.  Tery Sanfilippo. Docusate Calcium (STOOL SOFTENER PO) Take 1 tablet by mouth at bedtime.  . Iron-Folic Acid-Vit B12 (IRON FORMULA PO) Take 2 tablets by mouth daily.  . meloxicam (MOBIC) 15 MG tablet TAKE 1 TABLET (15 MG TOTAL) BY MOUTH DAILY.  Marland Kitchen. Misc Natural Products (OSTEO BI-FLEX JOINT SHIELD PO) Take 1 capsule by mouth as needed.   . valACYclovir (VALTREX) 500 MG tablet Take 1 tablet (500 mg total) by mouth 2 (two) times daily.   No current facility-administered medications on file prior to visit.    Review of Systems Per HPI unless specifically indicated in ROS section     Objective:    BP 110/68 mmHg  Pulse 68  Temp(Src) 98.3 F (36.8 C) (Oral)  Wt 269 lb 8 oz (122.244 kg)  SpO2 99%  Wt Readings from Last 3 Encounters:  11/26/15 269 lb 8 oz (122.244 kg)  10/22/15 269 lb 8 oz (122.244 kg)  09/09/14 286 lb 12 oz (130.069 kg)    Physical Exam  Constitutional: She appears well-developed and well-nourished. No  distress.  Skin: Skin is warm and dry. No rash noted. No erythema.  R lateral side with papule at site of tick bite without evident residual tick part and without surrounding erythema or induration  Nursing note and vitals reviewed.      Assessment & Plan:   Problem List Items Addressed This Visit    Tick bite - Primary    No signs of cellulitis or tick borne illness - discussed red flags to update us for abx course. Reassured.  Supportive care with warm compresses recommended.  Tick bite prevention handout provided today.          Follow up plan: Return if symptoms worsen or fail to improve.  Eustaquio BoydenJavier Savino Whisenant, MD

## 2015-11-26 NOTE — Assessment & Plan Note (Signed)
No signs of cellulitis or tick borne illness - discussed red flags to update us for abx course. Reassured.  Supportive care with warm compresses recommended.  Tick bite prevention handout provided today.

## 2016-10-24 ENCOUNTER — Ambulatory Visit (INDEPENDENT_AMBULATORY_CARE_PROVIDER_SITE_OTHER): Payer: Managed Care, Other (non HMO) | Admitting: Internal Medicine

## 2016-10-24 ENCOUNTER — Encounter: Payer: Self-pay | Admitting: Internal Medicine

## 2016-10-24 VITALS — BP 112/78 | HR 78 | Temp 98.0°F | Ht 64.0 in | Wt 275.0 lb

## 2016-10-24 DIAGNOSIS — Z0000 Encounter for general adult medical examination without abnormal findings: Secondary | ICD-10-CM

## 2016-10-24 DIAGNOSIS — Z124 Encounter for screening for malignant neoplasm of cervix: Secondary | ICD-10-CM | POA: Diagnosis not present

## 2016-10-24 DIAGNOSIS — Z1159 Encounter for screening for other viral diseases: Secondary | ICD-10-CM

## 2016-10-24 DIAGNOSIS — Z1231 Encounter for screening mammogram for malignant neoplasm of breast: Secondary | ICD-10-CM | POA: Diagnosis not present

## 2016-10-24 DIAGNOSIS — Z1239 Encounter for other screening for malignant neoplasm of breast: Secondary | ICD-10-CM

## 2016-10-24 DIAGNOSIS — Z114 Encounter for screening for human immunodeficiency virus [HIV]: Secondary | ICD-10-CM

## 2016-10-24 LAB — LIPID PANEL
CHOL/HDL RATIO: 3
Cholesterol: 181 mg/dL (ref 0–200)
HDL: 71.3 mg/dL (ref >39.00)
LDL CALC: 100 mg/dL — AB (ref 0–99)
NonHDL: 109.76
TRIGLYCERIDES: 50 mg/dL (ref 0.0–149.0)
VLDL: 10 mg/dL (ref 0.0–40.0)

## 2016-10-24 LAB — COMPREHENSIVE METABOLIC PANEL
ALT: 10 U/L (ref 0–35)
AST: 19 U/L (ref 0–37)
Albumin: 4.1 g/dL (ref 3.5–5.2)
Alkaline Phosphatase: 77 U/L (ref 39–117)
BILIRUBIN TOTAL: 0.5 mg/dL (ref 0.2–1.2)
BUN: 16 mg/dL (ref 6–23)
CO2: 29 meq/L (ref 19–32)
Calcium: 9.1 mg/dL (ref 8.4–10.5)
Chloride: 102 mEq/L (ref 96–112)
Creatinine, Ser: 0.75 mg/dL (ref 0.40–1.20)
GFR: 102.79 mL/min (ref >60.00)
GLUCOSE: 93 mg/dL (ref 70–99)
Potassium: 3.8 mEq/L (ref 3.5–5.1)
SODIUM: 137 meq/L (ref 135–145)
Total Protein: 7.3 g/dL (ref 6.0–8.3)

## 2016-10-24 LAB — HEMOGLOBIN A1C: Hgb A1c MFr Bld: 5.4 % (ref 4.6–6.5)

## 2016-10-24 LAB — CBC
HCT: 41.5 % (ref 36.0–46.0)
HEMOGLOBIN: 13.4 g/dL (ref 12.0–15.0)
MCHC: 32.4 g/dL (ref 30.0–36.0)
MCV: 89 fl (ref 78.0–100.0)
Platelets: 336 10*3/uL (ref 150.0–400.0)
RBC: 4.67 Mil/uL (ref 3.87–5.11)
RDW: 13.4 % (ref 11.5–15.5)
WBC: 5.9 10*3/uL (ref 4.0–10.5)

## 2016-10-24 LAB — TSH: TSH: 3.64 u[IU]/mL (ref 0.35–4.50)

## 2016-10-24 LAB — VITAMIN D 25 HYDROXY (VIT D DEFICIENCY, FRACTURES): VITD: 34.95 ng/mL (ref 30.00–100.00)

## 2016-10-24 MED ORDER — VALACYCLOVIR HCL 500 MG PO TABS: 500.0000 mg | ORAL_TABLET | Freq: Two times a day (BID) | ORAL | 2 refills | Status: DC

## 2016-10-24 MED ORDER — MELOXICAM 15 MG PO TABS: 15.0000 mg | ORAL_TABLET | Freq: Every day | ORAL | 2 refills | Status: DC

## 2016-10-24 NOTE — Progress Notes (Addendum)
Subjective:    Patient ID: Valerie Jacobs, female    DOB: 01-31-1961, 56 y.o.   MRN: [id number]  HPI  Pt presents to the clinic today for her annual exam. She is requesting refills of Meloxicam and Valtrex today.  Flu: 03/2016 Tetanus: 10/2015 Pap Smear: 2015 Mammogram: 02/2015 Colon Screening: 2015 Vision Screening: yearly Dentist: biannually  Diet: She does eat meat. She consumes fruits and veggies daily. She does eat some fried food. She drinks mostly water, occasional soft drink. Exercise: None  Review of Systems      Past Medical History:  Diagnosis Date  . Chicken pox   . Genital herpes     Current Outpatient Prescriptions  Medication Sig Dispense Refill  . acetaminophen (TYLENOL) 325 MG tablet Take 650 mg by mouth as needed.    . Cholecalciferol (VITAMIN D) 2000 units CAPS Take 1 capsule by mouth daily.    Tery Sanfilippo. Docusate Calcium (STOOL SOFTENER PO) Take 1 tablet by mouth at bedtime.    . Iron-Folic Acid-Vit B12 (IRON FORMULA PO) Take 2 tablets by mouth daily.    . meloxicam (MOBIC) 15 MG tablet TAKE 1 TABLET (15 MG TOTAL) BY MOUTH DAILY. 30 tablet 2  . Misc Natural Products (OSTEO BI-FLEX JOINT SHIELD PO) Take 1 capsule by mouth as needed.     . valACYclovir (VALTREX) 500 MG tablet Take 1 tablet (500 mg total) by mouth 2 (two) times daily. 30 tablet 1   No current facility-administered medications for this visit.     No Known Allergies  Family History  Problem Relation Age of Onset  . Cancer Father     Prostate  . Cancer Sister     Breast  . Hypertension Maternal Aunt   . Cancer Paternal Aunt     Breast    Social History   Social History  . Marital status: Divorced    Spouse name: N/A  . Number of children: N/A  . Years of education: N/A   Occupational History  . Not on file.   Social History Main Topics  . Smoking status: Never Smoker  . Smokeless tobacco: Never Used  . Alcohol use 0.0 oz/week     Comment: wine--occasional  . Drug use: No    . Sexual activity: Not Currently   Other Topics Concern  . Not on file   Social History Narrative  . No narrative on file     Constitutional: Denies fever, malaise, fatigue, headache or abrupt weight changes.  HEENT: Denies eye pain, eye redness, ear pain, ringing in the ears, wax buildup, runny nose, nasal congestion, bloody nose, or sore throat. Respiratory: Denies difficulty breathing, shortness of breath, cough or sputum production.   Cardiovascular: Denies chest pain, chest tightness, palpitations or swelling in the hands or feet.  Gastrointestinal: Denies abdominal pain, bloating, constipation, diarrhea or blood in the stool.  GU: Denies urgency, frequency, pain with urination, burning sensation, blood in urine, odor or discharge. Musculoskeletal: Pt reports intermittent joint pain. Denies decrease in range of motion, difficulty with gait, muscle pain or joint swelling.  Skin: Denies redness, rashes, lesions or ulcercations.  Neurological: Denies dizziness, difficulty with memory, difficulty with speech or problems with balance and coordination.  Psych: Denies anxiety, depression, SI/HI.  No other specific complaints in a complete review of systems (except as listed in HPI above).  Objective:   Physical Exam   BP 112/78   Pulse 78   Temp 98 F (36.7 C) (Oral)   Ht  5\' 4"  (1.626 m)   Wt 275 lb (124.7 kg)   SpO2 98%   BMI 47.20 kg/m  Wt Readings from Last 3 Encounters:  10/24/16 275 lb (124.7 kg)  11/26/15 269 lb 8 oz (122.2 kg)  10/22/15 269 lb 8 oz (122.2 kg)    General: Appears her stated age, obese in NAD. Skin: Warm, dry and intact.  HEENT: Head: normal shape and size; Eyes: sclera white, no icterus, conjunctiva pink, PERRLA and EOMs intact; Right Ear: Cerumen impaction; Left Ear: Tm's gray and intact, normal light reflex; Throat/Mouth: Teeth present, mucosa pink and moist, no exudate, lesions or ulcerations noted.  Neck:  Neck supple, trachea midline. No  masses, lumps or thyromegaly present.  Cardiovascular: Normal rate and rhythm. S1,S2 noted.  No murmur, rubs or gallops noted. Trace BLE edema. No carotid bruits noted. Pulmonary/Chest: Normal effort and positive vesicular breath sounds. No respiratory distress. No wheezes, rales or ronchi noted.  Abdomen: Soft and nontender. Normal bowel sounds. No distention or masses noted. Liver, spleen and kidneys non palpable. Pelvic: Normal female anatomy. Cervix not visualized. Small amount of thin white discharge noted. Adnexa non palpable. Musculoskeletal: Strength 5/5 BUE/BLE. No difficulty with gait.  Neurological: Alert and oriented. Cranial nerves II-XII grossly intact. Coordination normal.  Psychiatric: Mood and affect normal. Behavior is normal. Judgment and thought content normal.    BMET    Component Value Date/Time   NA 139 10/22/2015 1502   K 4.1 10/22/2015 1502   CL 102 10/22/2015 1502   CO2 30 10/22/2015 1502   GLUCOSE 94 10/22/2015 1502   BUN 15 10/22/2015 1502   CREATININE 0.77 10/22/2015 1502   CALCIUM 9.4 10/22/2015 1502   CALCIUM 8.7 03/14/2007 0207   GFRNONAA 96 03/13/2007 0000   GFRAA 116 03/13/2007 0000    Lipid Panel     Component Value Date/Time   CHOL 181 10/22/2015 1502   TRIG 42.0 10/22/2015 1502   HDL 59.00 10/22/2015 1502   CHOLHDL 3 10/22/2015 1502   VLDL 8.4 10/22/2015 1502   LDLCALC 114 (H) 10/22/2015 1502    CBC    Component Value Date/Time   WBC 7.5 10/22/2015 1502   RBC 4.77 10/22/2015 1502   HGB 13.4 10/22/2015 1502   HCT 41.8 10/22/2015 1502   PLT 351.0 10/22/2015 1502   MCV 87.6 10/22/2015 1502   MCHC 32.2 10/22/2015 1502   RDW 15.1 10/22/2015 1502   LYMPHSABS 2.0 10/22/2015 1502   MONOABS 0.5 10/22/2015 1502   EOSABS 0.1 10/22/2015 1502   BASOSABS 0.0 10/22/2015 1502    Hgb A1C Lab Results  Component Value Date   HGBA1C 5.5 10/22/2015           Assessment & Plan:   Preventative Health Maintenance:  Flu and tetanus  UTD Pap smear today with STD screening Mammogram ordered- she will call to schedule  Colon screening UTD Encouraged her to consume a balanced diet and exercise regimen Advised her to see an eye doctor and dentist annually Will check CBC, CMET, Lipid, TSH, A1C, HIV and Hep C today  RTC in 1 year, sooner if needed Nicki Reaper, NP

## 2016-10-24 NOTE — Patient Instructions (Signed)
Health Maintenance for Postmenopausal Women Menopause is a normal process in which your reproductive ability comes to an end. This process happens gradually over a span of months to years, usually between the ages of 33 and 38. Menopause is complete when you have missed 12 consecutive menstrual periods. It is important to talk with your health care provider about some of the most common conditions that affect postmenopausal women, such as heart disease, cancer, and bone loss (osteoporosis). Adopting a healthy lifestyle and getting preventive care can help to promote your health and wellness. Those actions can also lower your chances of developing some of these common conditions. What should I know about menopause? During menopause, you may experience a number of symptoms, such as:  Moderate-to-severe hot flashes.  Night sweats.  Decrease in sex drive.  Mood swings.  Headaches.  Tiredness.  Irritability.  Memory problems.  Insomnia. Choosing to treat or not to treat menopausal changes is an individual decision that you make with your health care provider. What should I know about hormone replacement therapy and supplements? Hormone therapy products are effective for treating symptoms that are associated with menopause, such as hot flashes and night sweats. Hormone replacement carries certain risks, especially as you become older. If you are thinking about using estrogen or estrogen with progestin treatments, discuss the benefits and risks with your health care provider. What should I know about heart disease and stroke? Heart disease, heart attack, and stroke become more likely as you age. This may be due, in part, to the hormonal changes that your body experiences during menopause. These can affect how your body processes dietary fats, triglycerides, and cholesterol. Heart attack and stroke are both medical emergencies. There are many things that you can do to help prevent heart disease  and stroke:  Have your blood pressure checked at least every 1-2 years. High blood pressure causes heart disease and increases the risk of stroke.  If you are 48-61 years old, ask your health care provider if you should take aspirin to prevent a heart attack or a stroke.  Do not use any tobacco products, including cigarettes, chewing tobacco, or electronic cigarettes. If you need help quitting, ask your health care provider.  It is important to eat a healthy diet and maintain a healthy weight.  Be sure to include plenty of vegetables, fruits, low-fat dairy products, and lean protein.  Avoid eating foods that are high in solid fats, added sugars, or salt (sodium).  Get regular exercise. This is one of the most important things that you can do for your health.  Try to exercise for at least 150 minutes each week. The type of exercise that you do should increase your heart rate and make you sweat. This is known as moderate-intensity exercise.  Try to do strengthening exercises at least twice each week. Do these in addition to the moderate-intensity exercise.  Know your numbers.Ask your health care provider to check your cholesterol and your blood glucose. Continue to have your blood tested as directed by your health care provider. What should I know about cancer screening? There are several types of cancer. Take the following steps to reduce your risk and to catch any cancer development as early as possible. Breast Cancer  Practice breast self-awareness.  This means understanding how your breasts normally appear and feel.  It also means doing regular breast self-exams. Let your health care provider know about any changes, no matter how small.  If you are 40 or older,  have a clinician do a breast exam (clinical breast exam or CBE) every year. Depending on your age, family history, and medical history, it may be recommended that you also have a yearly breast X-ray (mammogram).  If you  have a family history of breast cancer, talk with your health care provider about genetic screening.  If you are at high risk for breast cancer, talk with your health care provider about having an MRI and a mammogram every year.  Breast cancer (BRCA) gene test is recommended for women who have family members with BRCA-related cancers. Results of the assessment will determine the need for genetic counseling and BRCA1 and for BRCA2 testing. BRCA-related cancers include these types:  Breast. This occurs in males or females.  Ovarian.  Tubal. This may also be called fallopian tube cancer.  Cancer of the abdominal or pelvic lining (peritoneal cancer).  Prostate.  Pancreatic. Cervical, Uterine, and Ovarian Cancer  Your health care provider may recommend that you be screened regularly for cancer of the pelvic organs. These include your ovaries, uterus, and vagina. This screening involves a pelvic exam, which includes checking for microscopic changes to the surface of your cervix (Pap test).  For women ages 21-65, health care providers may recommend a pelvic exam and a Pap test every three years. For women ages 23-65, they may recommend the Pap test and pelvic exam, combined with testing for human papilloma virus (HPV), every five years. Some types of HPV increase your risk of cervical cancer. Testing for HPV may also be done on women of any age who have unclear Pap test results.  Other health care providers may not recommend any screening for nonpregnant women who are considered low risk for pelvic cancer and have no symptoms. Ask your health care provider if a screening pelvic exam is right for you.  If you have had past treatment for cervical cancer or a condition that could lead to cancer, you need Pap tests and screening for cancer for at least 20 years after your treatment. If Pap tests have been discontinued for you, your risk factors (such as having a new sexual partner) need to be reassessed  to determine if you should start having screenings again. Some women have medical problems that increase the chance of getting cervical cancer. In these cases, your health care provider may recommend that you have screening and Pap tests more often.  If you have a family history of uterine cancer or ovarian cancer, talk with your health care provider about genetic screening.  If you have vaginal bleeding after reaching menopause, tell your health care provider.  There are currently no reliable tests available to screen for ovarian cancer. Lung Cancer  Lung cancer screening is recommended for adults 99-83 years old who are at high risk for lung cancer because of a history of smoking. A yearly low-dose CT scan of the lungs is recommended if you:  Currently smoke.  Have a history of at least 30 pack-years of smoking and you currently smoke or have quit within the past 15 years. A pack-year is smoking an average of one pack of cigarettes per day for one year. Yearly screening should:  Continue until it has been 15 years since you quit.  Stop if you develop a health problem that would prevent you from having lung cancer treatment. Colorectal Cancer  This type of cancer can be detected and can often be prevented.  Routine colorectal cancer screening usually begins at age 72 and continues  through age 75.  If you have risk factors for colon cancer, your health care provider may recommend that you be screened at an earlier age.  If you have a family history of colorectal cancer, talk with your health care provider about genetic screening.  Your health care provider may also recommend using home test kits to check for hidden blood in your stool.  A small camera at the end of a tube can be used to examine your colon directly (sigmoidoscopy or colonoscopy). This is done to check for the earliest forms of colorectal cancer.  Direct examination of the colon should be repeated every 5-10 years until  age 75. However, if early forms of precancerous polyps or small growths are found or if you have a family history or genetic risk for colorectal cancer, you may need to be screened more often. Skin Cancer  Check your skin from head to toe regularly.  Monitor any moles. Be sure to tell your health care provider:  About any new moles or changes in moles, especially if there is a change in a mole's shape or color.  If you have a mole that is larger than the size of a pencil eraser.  If any of your family members has a history of skin cancer, especially at a young age, talk with your health care provider about genetic screening.  Always use sunscreen. Apply sunscreen liberally and repeatedly throughout the day.  Whenever you are outside, protect yourself by wearing long sleeves, pants, a wide-brimmed hat, and sunglasses. What should I know about osteoporosis? Osteoporosis is a condition in which bone destruction happens more quickly than new bone creation. After menopause, you may be at an increased risk for osteoporosis. To help prevent osteoporosis or the bone fractures that can happen because of osteoporosis, the following is recommended:  If you are 19-50 years old, get at least 1,000 mg of calcium and at least 600 mg of vitamin D per day.  If you are older than age 50 but younger than age 70, get at least 1,200 mg of calcium and at least 600 mg of vitamin D per day.  If you are older than age 70, get at least 1,200 mg of calcium and at least 800 mg of vitamin D per day. Smoking and excessive alcohol intake increase the risk of osteoporosis. Eat foods that are rich in calcium and vitamin D, and do weight-bearing exercises several times each week as directed by your health care provider. What should I know about how menopause affects my mental health? Depression may occur at any age, but it is more common as you become older. Common symptoms of depression include:  Low or sad  mood.  Changes in sleep patterns.  Changes in appetite or eating patterns.  Feeling an overall lack of motivation or enjoyment of activities that you previously enjoyed.  Frequent crying spells. Talk with your health care provider if you think that you are experiencing depression. What should I know about immunizations? It is important that you get and maintain your immunizations. These include:  Tetanus, diphtheria, and pertussis (Tdap) booster vaccine.  Influenza every year before the flu season begins.  Pneumonia vaccine.  Shingles vaccine. Your health care provider may also recommend other immunizations. This information is not intended to replace advice given to you by your health care provider. Make sure you discuss any questions you have with your health care provider. Document Released: 07/29/2005 Document Revised: 12/25/2015 Document Reviewed: 03/10/2015 Elsevier Interactive Patient   Education  2017 [employer]  

## 2016-10-25 ENCOUNTER — Other Ambulatory Visit (HOSPITAL_COMMUNITY)
Admission: RE | Admit: 2016-10-25 | Discharge: 2016-10-25 | Disposition: A | Discharge status: Home / Self Care | Payer: Managed Care, Other (non HMO) | Source: Ambulatory Visit | Attending: Internal Medicine | Admitting: Internal Medicine

## 2016-10-25 DIAGNOSIS — Z124 Encounter for screening for malignant neoplasm of cervix: Secondary | ICD-10-CM | POA: Diagnosis not present

## 2016-10-25 LAB — HIV ANTIBODY (ROUTINE TESTING W REFLEX): HIV: NONREACTIVE

## 2016-10-25 LAB — HEPATITIS C ANTIBODY: HCV AB: NEGATIVE

## 2016-10-25 NOTE — Addendum Note (Signed)
Addended by: Roena MaladyEVONTENNO, Susana Duell Y on: 10/25/2016 08:34 AM   Modules accepted: Orders

## 2016-10-27 ENCOUNTER — Telehealth: Payer: Self-pay

## 2016-10-27 LAB — CYTOLOGY - PAP
ADEQUACY: ABSENT
Bacterial vaginitis: NEGATIVE
CANDIDA VAGINITIS: POSITIVE — AB
Chlamydia: NEGATIVE
Diagnosis: NEGATIVE
HPV (WINDOPATH): NOT DETECTED
Neisseria Gonorrhea: NEGATIVE
Trichomonas: NEGATIVE

## 2016-10-27 NOTE — Telephone Encounter (Signed)
Journiee notified by telephone to continue taking both the iron and Vit D.   Patient wants to know at what level does she need to be at before she is able to stop?  Please advise.

## 2016-10-27 NOTE — Telephone Encounter (Signed)
Pt left v/m; pt seen annual 10/24/16 and pt wants to know since lab results good does pt need to continue taking iron and vit D. Pt request cb.

## 2016-10-27 NOTE — Telephone Encounter (Signed)
Kimberely notified as instructed by telephone. 

## 2016-10-27 NOTE — Telephone Encounter (Signed)
Yes, continue taking both

## 2016-10-27 NOTE — Telephone Encounter (Signed)
Iron indefinitely. Vit D, at least 50.

## 2016-10-28 ENCOUNTER — Telehealth: Payer: Self-pay | Admitting: Internal Medicine

## 2016-10-28 ENCOUNTER — Other Ambulatory Visit: Payer: Self-pay | Admitting: Internal Medicine

## 2016-10-28 MED ORDER — FLUCONAZOLE 150 MG PO TABS: 150.0000 mg | ORAL_TABLET | Freq: Once | ORAL | 0 refills | Status: AC

## 2016-10-28 NOTE — Telephone Encounter (Signed)
Patient received Melanie's message about her lab work.  Patient said she's not having any symptoms of a yeast infection, but if Rene KocherRegina wants to call something in to her pharmacy that would be fine.  Patient uses [facility]. Patient can be reached at  [phone].

## 2017-02-10 ENCOUNTER — Ambulatory Visit
Admission: RE | Admit: 2017-02-10 | Discharge: 2017-02-10 | Disposition: A | Discharge status: Home / Self Care | Payer: Managed Care, Other (non HMO) | Source: Ambulatory Visit | Attending: Internal Medicine | Admitting: Internal Medicine

## 2017-02-10 DIAGNOSIS — Z1239 Encounter for other screening for malignant neoplasm of breast: Secondary | ICD-10-CM

## 2017-02-10 DIAGNOSIS — Z1231 Encounter for screening mammogram for malignant neoplasm of breast: Secondary | ICD-10-CM | POA: Diagnosis not present

## 2017-08-08 ENCOUNTER — Encounter: Payer: Self-pay | Admitting: Family Medicine

## 2017-08-08 ENCOUNTER — Ambulatory Visit: Payer: Self-pay | Admitting: Family Medicine

## 2017-08-08 VITALS — BP 124/72 | HR 58 | Temp 98.3°F | Resp 15 | Ht 65.0 in | Wt 290.0 lb

## 2017-08-08 DIAGNOSIS — Z299 Encounter for prophylactic measures, unspecified: Secondary | ICD-10-CM

## 2017-08-08 DIAGNOSIS — Z0000 Encounter for general adult medical examination without abnormal findings: Secondary | ICD-10-CM

## 2017-08-08 NOTE — Progress Notes (Signed)
   Subjective: Annual biometric screening     Patient ID: Valerie Jacobs, female    DOB: March 06, 1961, 57 y.o.   MRN: [id number]  HPI Patient presents today for annual biometric screening. Patient works for [employer] and is primarily seated throughout the work day. Patient denies any complaints or concerns. Patient reports bilateral knee pain in the past that has improved significantly with stretching exercises.   Review of Systems Constitutional: No weight change, no fever, no chills, no night sweats, no fatigue, no malaise.  Musculoskeletal: No arthralgias, myalgias, joint swelling, joint stiffness, back pain, neck pain. ROS otherwise negative.      Objective:   Physical Exam General: Awake, alert and oriented. No acute distress. Well developed, hydrated and nourished. Appears stated age.  HEENT: PERRLA, EOM intact, supple neck without adenopathy. Sclera is non-icteric. The ear canal is clear without discharge. The tympanic membrane is normal in appearance with normal landmarks and cone of light. Nasal mucosa is pink and moist. Oral mucosa is pink and moist with good dentition. The pharynx is normal in appearance without tonsillar swelling or exudates.  Skin: Skin in warm, dry and intact without rashes or lesions. Appropriate color for ethnicity. Nailbeds pink with no cyanosis or clubbing. Cardiac: The external chest is normal in appearance without lifts, heaves, or thrills. PMI is not visible and is palpated in the 5th intercostal space at the midclavicular line. Heart rate and rhythm are normal. No murmurs, gallops, or rubs are auscultated. S1 and S2 are heard and are of normal intensity.  Respiratory: The chest wall is symmetric and without deformity. No signs of respiratory distress. Lung sounds are clear in all lobes bilaterally without rales, ronchi, or wheezes.  Abdominal: Abdomen is soft, symmetric, and non-tender without distention. There are no visible lesions or scars. Umbilicus is midline  without herniation. Bowel sounds are present and normoactive in all four quadrants. No masses, hepatomegaly, or splenomegaly are noted.  Spine: Neck and back are without deformity, external skin changes, or signs of trauma. Curvature of the cervical, thoracic, and lumbar spine are within normal limits. Bony features of the shoulders and hips are of equal height bilaterally. Posture is upright, gait is smooth, steady, and within normal limits.  No tenderness noted on palpation of the spinous processes. Spinous processes are midline. Cervical, thoracic, and lumbar paraspinal muscles are not tender and are without spasm.  Extremities: Upper and lower extremities are atraumatic in appearance without tenderness or deformity. No swelling or erythema.Capillary refill is less than 3 seconds in all extremities. Pulses palpable. Steady gait noted.  Neurological: The patient is awake, alert and oriented to person, place, and time with normal speech. Memory is normal and thought process is intact. No gait abnormalities are appreciated.  Psychiatric: Appropriate mood and affect.     Assessment & Plan:  Biometric Screening - Annual Exam Medical conditions discussed.  Routine labs drawn.  Follow up with primary care provider as needed.

## 2017-08-09 LAB — CMP12+LP+TP+TSH+6AC+CBC/D/PLT
A/G RATIO: 1.4 (ref 1.2–2.2)
ALBUMIN: 4 g/dL (ref 3.5–5.5)
ALK PHOS: 97 IU/L (ref 39–117)
ALT: 12 IU/L (ref 0–32)
AST: 20 IU/L (ref 0–40)
BASOS ABS: 0 10*3/uL (ref 0.0–0.2)
BASOS: 0 %
BILIRUBIN TOTAL: 0.5 mg/dL (ref 0.0–1.2)
BUN/Creatinine Ratio: 11 (ref 9–23)
BUN: 9 mg/dL (ref 6–24)
CHLORIDE: 104 mmol/L (ref 96–106)
CHOLESTEROL TOTAL: 185 mg/dL (ref 100–199)
Calcium: 8.6 mg/dL — ABNORMAL LOW (ref 8.7–10.2)
Chol/HDL Ratio: 2.8 ratio (ref 0.0–4.4)
Creatinine, Ser: 0.83 mg/dL (ref 0.57–1.00)
EOS (ABSOLUTE): 0.1 10*3/uL (ref 0.0–0.4)
EOS: 1 %
Estimated CHD Risk: <0.5 times avg. (ref 0.0–1.0)
FREE THYROXINE INDEX: 1.9 (ref 1.2–4.9)
GFR calc Af Amer: 91 mL/min/{1.73_m2} (ref >59)
GFR calc non Af Amer: 79 mL/min/{1.73_m2} (ref >59)
GGT: 7 IU/L (ref 0–60)
GLOBULIN, TOTAL: 2.8 g/dL (ref 1.5–4.5)
Glucose: 94 mg/dL (ref 65–99)
HDL: 66 mg/dL (ref >39)
HEMATOCRIT: 44.2 % (ref 34.0–46.6)
HEMOGLOBIN: 14.2 g/dL (ref 11.1–15.9)
IMMATURE GRANS (ABS): 0 10*3/uL (ref 0.0–0.1)
IMMATURE GRANULOCYTES: 0 %
Iron: 129 ug/dL (ref 27–159)
LDH: 236 IU/L — ABNORMAL HIGH (ref 119–226)
LDL CALC: 107 mg/dL — AB (ref 0–99)
LYMPHS ABS: 1.5 10*3/uL (ref 0.7–3.1)
Lymphs: 32 %
MCH: 28.6 pg (ref 26.6–33.0)
MCHC: 32.1 g/dL (ref 31.5–35.7)
MCV: 89 fL (ref 79–97)
MONOS ABS: 0.4 10*3/uL (ref 0.1–0.9)
Monocytes: 9 %
NEUTROS PCT: 58 %
Neutrophils Absolute: 2.6 10*3/uL (ref 1.4–7.0)
PLATELETS: 340 10*3/uL (ref 150–379)
Phosphorus: 3.9 mg/dL (ref 2.5–4.5)
Potassium: 4.7 mmol/L (ref 3.5–5.2)
RBC: 4.97 x10E6/uL (ref 3.77–5.28)
RDW: 14.9 % (ref 12.3–15.4)
SODIUM: 143 mmol/L (ref 134–144)
T3 Uptake Ratio: 28 % (ref 24–39)
T4, Total: 6.7 ug/dL (ref 4.5–12.0)
TRIGLYCERIDES: 61 mg/dL (ref 0–149)
TSH: 3.81 u[IU]/mL (ref 0.450–4.500)
Total Protein: 6.8 g/dL (ref 6.0–8.5)
Uric Acid: 4.5 mg/dL (ref 2.5–7.1)
VLDL CHOLESTEROL CAL: 12 mg/dL (ref 5–40)
WBC: 4.5 10*3/uL (ref 3.4–10.8)

## 2017-08-09 LAB — VITAMIN B12: Vitamin B-12: <150 pg/mL — ABNORMAL LOW (ref 232–1245)

## 2017-10-17 ENCOUNTER — Other Ambulatory Visit: Payer: Self-pay | Admitting: Internal Medicine

## 2017-10-17 DIAGNOSIS — Z0000 Encounter for general adult medical examination without abnormal findings: Secondary | ICD-10-CM

## 2017-10-23 ENCOUNTER — Other Ambulatory Visit: Payer: Self-pay | Admitting: Internal Medicine

## 2017-10-23 DIAGNOSIS — Z0000 Encounter for general adult medical examination without abnormal findings: Secondary | ICD-10-CM

## 2017-10-31 ENCOUNTER — Encounter: Payer: Self-pay | Admitting: Internal Medicine

## 2017-10-31 ENCOUNTER — Ambulatory Visit (INDEPENDENT_AMBULATORY_CARE_PROVIDER_SITE_OTHER): Payer: Managed Care, Other (non HMO) | Admitting: Internal Medicine

## 2017-10-31 DIAGNOSIS — M25561 Pain in right knee: Secondary | ICD-10-CM

## 2017-10-31 DIAGNOSIS — A6004 Herpesviral vulvovaginitis: Secondary | ICD-10-CM | POA: Diagnosis not present

## 2017-10-31 DIAGNOSIS — D508 Other iron deficiency anemias: Secondary | ICD-10-CM

## 2017-10-31 DIAGNOSIS — M25562 Pain in left knee: Secondary | ICD-10-CM

## 2017-10-31 DIAGNOSIS — G8929 Other chronic pain: Secondary | ICD-10-CM | POA: Diagnosis not present

## 2017-10-31 DIAGNOSIS — E538 Deficiency of other specified B group vitamins: Secondary | ICD-10-CM | POA: Diagnosis not present

## 2017-10-31 MED ORDER — CYANOCOBALAMIN 1000 MCG/ML IJ SOLN
1000.0000 ug | INTRAMUSCULAR | Status: DC
Start: 2017-10-31 — End: 2019-01-03
  Administered 2017-10-31: 1000 ug via INTRAMUSCULAR

## 2017-10-31 NOTE — Addendum Note (Signed)
Addended by: Roena Malady on: 10/31/2017 09:49 AM   Modules accepted: Orders

## 2017-10-31 NOTE — Assessment & Plan Note (Signed)
Encouraged weight loss Continue Meloxicam as needed 

## 2017-10-31 NOTE — Assessment & Plan Note (Signed)
Will start B12 injections, 1st injection received today

## 2017-10-31 NOTE — Assessment & Plan Note (Signed)
Continue Valtrex as needed

## 2017-10-31 NOTE — Patient Instructions (Signed)
Vitamin B12 Deficiency Vitamin B12 deficiency means that your body is not getting enough vitamin B12. Your body needs vitamin B12 for important bodily functions. If you do not have enough vitamin B12 in your body, you can have health problems. Follow these instructions at home:  Take supplements only as told by your doctor. Follow the directions carefully.  Get any shots (injections) as told by your doctor. Do not miss your visits to the doctor.  Eat lots of healthy foods that contain vitamin B12. Ask your doctor if you should work with someone who is trained in how food affects health (dietitian). Foods that contain vitamin B12 include: ? Meat. ? Meat from birds (poultry). ? Fish. ? Eggs. ? Cereal and dairy products that are fortified. This means that vitamin B12 has been added to the food. Check the label on the package to see if the food is fortified.  Do not drink too much (do not abuse) alcohol.  Keep all follow-up visits as told by your doctor. This is important. Contact a doctor if:  Your symptoms come back. Get help right away if:  You have trouble breathing.  You have chest pain.  You get dizzy.  You pass out (lose consciousness). This information is not intended to replace advice given to you by your health care provider. Make sure you discuss any questions you have with your health care provider. Document Released: 05/26/2011 Document Revised: 11/12/2015 Document Reviewed: 10/22/2014 Elsevier Interactive Patient Education  2018 Elsevier Inc.  

## 2017-10-31 NOTE — Progress Notes (Signed)
Subjective:    Patient ID: Valerie Jacobs, female    DOB: Sep 06, 1960, 57 y.o.   MRN: [id number]  HPI  Pt presents to the clinic today to follow up chronic conditions.  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 Complicated by obesity. She takes Meloxicam as needed with good relief of symptoms.  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 She takes Valtrex as needed.  Iron Deficiency Anemia: Her last H/H was 14.2/44.2, 07/2017. She denies any s/s of bleeding. She is taking her iron supplement as prescribed. She reports constipation is not an issue.  B12 Deficiency: Last level from 07/2017 was < 150. She denies daytime fatigue.   Review of Systems      Past Medical History:  Diagnosis Date  . Chicken pox   . Genital herpes     Current Outpatient Medications  Medication Sig Dispense Refill  . acetaminophen (TYLENOL) 325 MG tablet Take 650 mg by mouth as needed.    . Cholecalciferol (VITAMIN D) 2000 units CAPS Take 1 capsule by mouth daily.    . Iron-Folic Acid-Vit B12 (IRON FORMULA PO) Take 2 tablets by mouth daily.    . meloxicam (MOBIC) 15 MG tablet TAKE ONE TABLET BY MOUTH DAILY 30 tablet 0  . valACYclovir (VALTREX) 500 MG tablet TAKE ONE TABLET BY MOUTH TWO TIMES DAILY 30 tablet 0   No current facility-administered medications for this visit.     No Known Allergies  Family History  Problem Relation Age of Onset  . Cancer Father        Prostate  . Cancer Sister        Breast  . Hypertension Maternal Aunt   . Cancer Paternal Aunt        Breast  . Breast cancer Neg Hx     Social History   Socioeconomic History  . Marital status: Divorced    Spouse name: Not on file  . Number of children: Not on file  . Years of education: Not on file  . Highest education level: Not on file  Occupational History  . Not on file  Social Needs  . Financial resource strain: Not on file  . Food insecurity:    Worry: Not on file    Inability: Not on file  . Transportation needs:      Medical: Not on file    Non-medical: Not on file  Tobacco Use  . Smoking status: Never Smoker  . Smokeless tobacco: Never Used  Substance and Sexual Activity  . Alcohol use: Yes    Alcohol/week: 0.0 oz    Comment: wine--occasional  . Drug use: No  . Sexual activity: Not Currently  Lifestyle  . Physical activity:    Days per week: Not on file    Minutes per session: Not on file  . Stress: Not on file  Relationships  . Social connections:    Talks on phone: Not on file    Gets together: Not on file    Attends religious service: Not on file    Active member of club or organization: Not on file    Attends meetings of clubs or organizations: Not on file    Relationship status: Not on file  . Intimate partner violence:    Fear of current or ex partner: Not on file    Emotionally abused: Not on file    Physically abused: Not on file    Forced sexual activity: Not on file  Other Topics Concern  . Not on  file  Social History Narrative  . Not on file     Constitutional: Pt reports weight gain. Denies fever, malaise, fatigue, headache.  Respiratory: Denies difficulty breathing, shortness of breath, cough or sputum production.   Cardiovascular: Denies chest pain, chest tightness, palpitations or swelling in the hands or feet.  Gastrointestinal: Denies abdominal pain, bloating, constipation, diarrhea or blood in the stool.  Musculoskeletal: Pt reports intermittent knee pain. Denies decrease in range of motion, difficulty with gait, muscle pain or joint swelling.  Skin: Denies redness, rashes, lesions or ulcercations.  Neurological: Denies dizziness, difficulty with memory, difficulty with speech or problems with balance and coordination.  Psych: Denies anxiety, depression, SI/HI.  No other specific complaints in a complete review of systems (except as listed in HPI above).  Objective:   Physical Exam   BP 118/68   Pulse 60   Temp 98.3 F (36.8 C) (Oral)   Ht  (1.626  m)   Wt 292 lb (132.5 kg)   SpO2 100%   BMI 50.12 kg/m  Wt Readings from Last 3 Encounters:  10/31/17 292 lb (132.5 kg)  10/24/16 275 lb (124.7 kg)  11/26/15 269 lb 8 oz (122.2 kg)    General: Appears her stated age, obese in NAD. Cardiovascular: Normal rate and rhythm. S1,S2 noted.  No murmur, rubs or gallops noted.  Pulmonary/Chest: Normal effort and positive vesicular breath sounds. No respiratory distress. No wheezes, rales or ronchi noted.  Musculoskeletal: No signs of joint swelling. Strength 5/5 BUE/BLE. No difficulty with gait.  Neurological: Alert and oriented. Psychiatric: Mood and affect normal. Behavior is normal. Judgment and thought content normal.     BMET    Component Value Date/Time   NA 143 08/08/2017 0820   K 4.7 08/08/2017 0820   CL 104 08/08/2017 0820   CO2 29 10/24/2016 1500   GLUCOSE 94 08/08/2017 0820   GLUCOSE 93 10/24/2016 1500   BUN 9 08/08/2017 0820   CREATININE 0.83 08/08/2017 0820   CALCIUM 8.6 (L) 08/08/2017 0820   CALCIUM 8.7 03/14/2007 0207   GFRNONAA 79 08/08/2017 0820   GFRAA 91 08/08/2017 0820    Lipid Panel     Component Value Date/Time   CHOL 185 08/08/2017 0820   TRIG 61 08/08/2017 0820   HDL 66 08/08/2017 0820   CHOLHDL 2.8 08/08/2017 0820   CHOLHDL 3 10/24/2016 1500   VLDL 10.0 10/24/2016 1500   LDLCALC 107 (H) 08/08/2017 0820    CBC    Component Value Date/Time   WBC 4.5 08/08/2017 0820   WBC 5.9 10/24/2016 1500   RBC 4.97 08/08/2017 0820   RBC 4.67 10/24/2016 1500   HGB 14.2 08/08/2017 0820   HCT 44.2 08/08/2017 0820   PLT 340 08/08/2017 0820   MCV 89 08/08/2017 0820   MCH 28.6 08/08/2017 0820   MCHC 32.1 08/08/2017 0820   MCHC 32.4 10/24/2016 1500   RDW 14.9 08/08/2017 0820   LYMPHSABS 1.5 08/08/2017 0820   MONOABS 0.5 10/22/2015 1502   EOSABS 0.1 08/08/2017 0820   BASOSABS 0.0 08/08/2017 0820    Hgb A1C Lab Results  Component Value Date   HGBA1C 5.4 10/24/2016          Assessment & Plan:

## 2017-10-31 NOTE — Assessment & Plan Note (Signed)
Recent H/H normal Continue iron supplement for now

## 2017-11-19 ENCOUNTER — Other Ambulatory Visit: Payer: Self-pay | Admitting: Internal Medicine

## 2017-11-19 DIAGNOSIS — Z0000 Encounter for general adult medical examination without abnormal findings: Secondary | ICD-10-CM

## 2017-11-20 NOTE — Telephone Encounter (Signed)
Last filled 10/19/17... Please advise 

## 2017-11-30 ENCOUNTER — Ambulatory Visit (INDEPENDENT_AMBULATORY_CARE_PROVIDER_SITE_OTHER): Payer: Managed Care, Other (non HMO)

## 2017-11-30 DIAGNOSIS — E538 Deficiency of other specified B group vitamins: Secondary | ICD-10-CM | POA: Diagnosis not present

## 2017-11-30 MED ORDER — CYANOCOBALAMIN 1000 MCG/ML IJ SOLN
1000.0000 ug | Freq: Once | INTRAMUSCULAR | Status: AC
  Administered 2017-11-30: 1000 ug via INTRAMUSCULAR

## 2018-01-02 ENCOUNTER — Ambulatory Visit (INDEPENDENT_AMBULATORY_CARE_PROVIDER_SITE_OTHER): Payer: Managed Care, Other (non HMO)

## 2018-01-02 DIAGNOSIS — E538 Deficiency of other specified B group vitamins: Secondary | ICD-10-CM

## 2018-01-02 MED ORDER — CYANOCOBALAMIN 1000 MCG/ML IJ SOLN
1000.0000 ug | Freq: Once | INTRAMUSCULAR | Status: AC
  Administered 2018-01-02: 1000 ug via INTRAMUSCULAR

## 2018-01-02 NOTE — Progress Notes (Signed)
Per orders of Regina Baity, NP-C, injection of Vitamin B12 given by Devontenno, Melanie Y. Patient tolerated injection well. 

## 2018-02-07 ENCOUNTER — Ambulatory Visit (INDEPENDENT_AMBULATORY_CARE_PROVIDER_SITE_OTHER): Payer: Managed Care, Other (non HMO) | Admitting: *Deleted

## 2018-02-07 DIAGNOSIS — E538 Deficiency of other specified B group vitamins: Secondary | ICD-10-CM

## 2018-02-07 MED ORDER — CYANOCOBALAMIN 1000 MCG/ML IJ SOLN
1000.0000 ug | Freq: Once | INTRAMUSCULAR | Status: AC
  Administered 2018-02-07: 1000 ug via INTRAMUSCULAR

## 2018-02-07 NOTE — Progress Notes (Signed)
Per orders of Dr. Tower, injection of b12 given by Gilliam Hawkes H. Patient tolerated injection well.  

## 2018-02-26 ENCOUNTER — Other Ambulatory Visit: Payer: Self-pay | Admitting: Internal Medicine

## 2018-02-26 DIAGNOSIS — Z0000 Encounter for general adult medical examination without abnormal findings: Secondary | ICD-10-CM

## 2018-03-14 ENCOUNTER — Ambulatory Visit (INDEPENDENT_AMBULATORY_CARE_PROVIDER_SITE_OTHER): Payer: Managed Care, Other (non HMO) | Admitting: *Deleted

## 2018-03-14 DIAGNOSIS — E538 Deficiency of other specified B group vitamins: Secondary | ICD-10-CM

## 2018-03-14 DIAGNOSIS — Z23 Encounter for immunization: Secondary | ICD-10-CM

## 2018-03-14 MED ORDER — CYANOCOBALAMIN 1000 MCG/ML IJ SOLN
1000.0000 ug | Freq: Once | INTRAMUSCULAR | Status: AC
  Administered 2018-03-14: 1000 ug via INTRAMUSCULAR

## 2018-03-14 NOTE — Progress Notes (Signed)
Per orders of Dr. Letvak, injection of b12 given by Jatavis Malek H. Patient tolerated injection well.  

## 2018-03-29 ENCOUNTER — Ambulatory Visit: Payer: Managed Care, Other (non HMO) | Admitting: Internal Medicine

## 2018-03-29 ENCOUNTER — Encounter: Payer: Self-pay | Admitting: Internal Medicine

## 2018-03-29 VITALS — BP 122/84 | HR 67 | Temp 97.7°F | Wt 306.0 lb

## 2018-03-29 DIAGNOSIS — R609 Edema, unspecified: Secondary | ICD-10-CM | POA: Diagnosis not present

## 2018-03-29 NOTE — Patient Instructions (Signed)

## 2018-03-29 NOTE — Progress Notes (Signed)
Subjective:    Patient ID: Valerie Jacobs, female    DOB: March 22, 1961, 57 y.o.   MRN: [id number]  HPI  Pt presents to the clinic today with c/o peripheral edema. She reports last week, she noticed swelling in her feet and ankles. She has been standing on her feet more lately. She has not increased the amount of salt in her diet. She elevated her legs and the swelling has since resolved. The swelling was not painful, red or warm to touch. She denies associated cough or shortness of breath. She has no history of heart failure. She is concerned because this has happened in the past. There is no echocardiogram on file.  Review of Systems  Past Medical History:  Diagnosis Date  . Chicken pox   . Genital herpes     Current Outpatient Medications  Medication Sig Dispense Refill  . acetaminophen (TYLENOL) 325 MG tablet Take 650 mg by mouth as needed.    . Cholecalciferol (VITAMIN D) 2000 units CAPS Take 1 capsule by mouth daily.    . Iron-Folic Acid-Vit B12 (IRON FORMULA PO) Take 2 tablets by mouth daily.    . meloxicam (MOBIC) 15 MG tablet TAKE ONE TABLET BY MOUTH DAILY 30 tablet 5  . valACYclovir (VALTREX) 500 MG tablet TAKE ONE TABLET BY MOUTH TWO TIMES DAILY 30 tablet 1   Current Facility-Administered Medications  Medication Dose Route Frequency Provider Last Rate Last Dose  . cyanocobalamin ((VITAMIN B-12)) injection 1,000 mcg  1,000 mcg Intramuscular Q30 days Lorre Munroe, NP   1,000 mcg at 10/31/17 1308    No Known Allergies  Family History  Problem Relation Age of Onset  . Cancer Father        Prostate  . Cancer Sister        Breast  . Hypertension Maternal Aunt   . Cancer Paternal Aunt        Breast  . Breast cancer Neg Hx     Social History   Socioeconomic History  . Marital status: Divorced    Spouse name: Not on file  . Number of children: Not on file  . Years of education: Not on file  . Highest education level: Not on file  Occupational History  . Not on  file  Social Needs  . Financial resource strain: Not on file  . Food insecurity:    Worry: Not on file    Inability: Not on file  . Transportation needs:    Medical: Not on file    Non-medical: Not on file  Tobacco Use  . Smoking status: Never Smoker  . Smokeless tobacco: Never Used  Substance and Sexual Activity  . Alcohol use: Yes    Alcohol/week: 0.0 standard drinks    Comment: wine--occasional  . Drug use: No  . Sexual activity: Not Currently  Lifestyle  . Physical activity:    Days per week: Not on file    Minutes per session: Not on file  . Stress: Not on file  Relationships  . Social connections:    Talks on phone: Not on file    Gets together: Not on file    Attends religious service: Not on file    Active member of club or organization: Not on file    Attends meetings of clubs or organizations: Not on file    Relationship status: Not on file  . Intimate partner violence:    Fear of current or ex partner: Not on file  Emotionally abused: Not on file    Physically abused: Not on file    Forced sexual activity: Not on file  Other Topics Concern  . Not on file  Social History Narrative  . Not on file     Constitutional: Denies fever, malaise, fatigue, headache or abrupt weight changes.  Respiratory: Denies difficulty breathing, shortness of breath, cough or sputum production.   Cardiovascular: Pt reports swelling of feet. Denies chest pain, chest tightness, palpitations or swelling in the hands.  Musculoskeletal: Denies decrease in range of motion, difficulty with gait, muscle pain or joint pai.  Skin: Denies redness, rashes, lesions or ulcercations.    No other specific complaints in a complete review of systems (except as listed in HPI above).     Objective:   Physical Exam   BP 122/84   Pulse 67   Temp 97.7 F (36.5 C) (Oral)   Wt (!) 306 lb (138.8 kg)   SpO2 98%   BMI 52.52 kg/m   Wt Readings from Last 3 Encounters:  03/29/18 (!) 306 lb  (138.8 kg)  10/31/17 292 lb (132.5 kg)  10/24/16 275 lb (124.7 kg)    General: Appears her stated age, obese, in NAD. Skin: Warm, dry and intact. No redness or warmth noted of BLE. Cardiovascular: Normal rate and rhythm. S1,S2 noted.  No murmur, rubs or gallops noted. Trace pitting BLE edema.  Pulmonary/Chest: Normal effort and positive vesicular breath sounds. No respiratory distress. No wheezes, rales or ronchi noted.  Musculoskeletal:  No difficulty with gait.  Neurological: Alert and oriented.    BMET    Component Value Date/Time   NA 143 08/08/2017 0820   K 4.7 08/08/2017 0820   CL 104 08/08/2017 0820   CO2 29 10/24/2016 1500   GLUCOSE 94 08/08/2017 0820   GLUCOSE 93 10/24/2016 1500   BUN 9 08/08/2017 0820   CREATININE 0.83 08/08/2017 0820   CALCIUM 8.6 (L) 08/08/2017 0820   CALCIUM 8.7 03/14/2007 0207   GFRNONAA 79 08/08/2017 0820   GFRAA 91 08/08/2017 0820    Lipid Panel     Component Value Date/Time   CHOL 185 08/08/2017 0820   TRIG 61 08/08/2017 0820   HDL 66 08/08/2017 0820   CHOLHDL 2.8 08/08/2017 0820   CHOLHDL 3 10/24/2016 1500   VLDL 10.0 10/24/2016 1500   LDLCALC 107 (H) 08/08/2017 0820    CBC    Component Value Date/Time   WBC 4.5 08/08/2017 0820   WBC 5.9 10/24/2016 1500   RBC 4.97 08/08/2017 0820   RBC 4.67 10/24/2016 1500   HGB 14.2 08/08/2017 0820   HCT 44.2 08/08/2017 0820   PLT 340 08/08/2017 0820   MCV 89 08/08/2017 0820   MCH 28.6 08/08/2017 0820   MCHC 32.1 08/08/2017 0820   MCHC 32.4 10/24/2016 1500   RDW 14.9 08/08/2017 0820   LYMPHSABS 1.5 08/08/2017 0820   MONOABS 0.5 10/22/2015 1502   EOSABS 0.1 08/08/2017 0820   BASOSABS 0.0 08/08/2017 0820    Hgb A1C Lab Results  Component Value Date   HGBA1C 5.4 10/24/2016          Assessment & Plan:   Peripheral Edema:  Encouraged low salt diet and exercise for weight loss Encouraged elevation Can try compression hose if able If worse, consider low dose HCTZ daily  RTC  as needed or if symptoms persist or worsen Nicki Reaper, NP

## 2018-04-19 ENCOUNTER — Ambulatory Visit (INDEPENDENT_AMBULATORY_CARE_PROVIDER_SITE_OTHER): Payer: Managed Care, Other (non HMO)

## 2018-04-19 DIAGNOSIS — E538 Deficiency of other specified B group vitamins: Secondary | ICD-10-CM

## 2018-04-19 MED ORDER — CYANOCOBALAMIN 1000 MCG/ML IJ SOLN
1000.0000 ug | Freq: Once | INTRAMUSCULAR | Status: AC
  Administered 2018-04-19: 1000 ug via INTRAMUSCULAR

## 2018-04-19 NOTE — Progress Notes (Signed)
Pt given 1ml in Right Deltoid. Tolerated well.

## 2018-05-23 ENCOUNTER — Ambulatory Visit (INDEPENDENT_AMBULATORY_CARE_PROVIDER_SITE_OTHER): Payer: Managed Care, Other (non HMO) | Admitting: *Deleted

## 2018-05-23 DIAGNOSIS — E538 Deficiency of other specified B group vitamins: Secondary | ICD-10-CM

## 2018-05-23 MED ORDER — CYANOCOBALAMIN 1000 MCG/ML IJ SOLN
1000.0000 ug | Freq: Once | INTRAMUSCULAR | Status: AC
  Administered 2018-05-23: 1000 ug via INTRAMUSCULAR

## 2018-05-23 NOTE — Progress Notes (Signed)
Per orders of Dr. Tower, injection of b12 given by Silas Sedam H. Patient tolerated injection well.  

## 2018-06-26 ENCOUNTER — Encounter: Payer: Self-pay | Admitting: Emergency Medicine

## 2018-06-26 ENCOUNTER — Ambulatory Visit: Payer: Self-pay | Admitting: Emergency Medicine

## 2018-06-26 VITALS — BP 138/68 | Ht 64.0 in | Wt 289.0 lb

## 2018-06-26 DIAGNOSIS — Z008 Encounter for other general examination: Secondary | ICD-10-CM

## 2018-06-26 DIAGNOSIS — Z0189 Encounter for other specified special examinations: Principal | ICD-10-CM

## 2018-06-26 NOTE — Progress Notes (Signed)
[facility]  JELINA MELNIK DOB: 58 y.o. MRN: [id number]  Subjective:  Here for Biometric Screen/brief exam Followed by PCP at [facility] for all preventive care.  History of B12 deficiency, gets monthly B12 shots there.  PCP is monitoring her B12 levels.   Objective: Blood pressure 138/68, height 5\' 4"  (1.626 m), weight 289 lb (131.1 kg). NAD HEENT: Within normal limits Neck: Normal Heart: Regular rate and rhythm Lungs: Clear  Assessment: Biometric screen  Plan:  Fasting glucose and lipids. Discussed some health issues, including healthy eating habits and exercise, and weight loss. Encouraged to follow-up with PCP for annual comprehensive preventive and wellness care (and if applicable, any chronic issues). Questions invited and answered.

## 2018-06-27 LAB — LIPID PANEL WITH LDL/HDL RATIO
Cholesterol, Total: 217 mg/dL — ABNORMAL HIGH (ref 100–199)
HDL: 80 mg/dL (ref >39)
LDL Calculated: 127 mg/dL — ABNORMAL HIGH (ref 0–99)
LDl/HDL Ratio: 1.6 ratio (ref 0.0–3.2)
Triglycerides: 52 mg/dL (ref 0–149)
VLDL Cholesterol Cal: 10 mg/dL (ref 5–40)

## 2018-06-27 LAB — GLUCOSE, RANDOM: Glucose: 82 mg/dL (ref 65–99)

## 2018-06-28 ENCOUNTER — Ambulatory Visit (INDEPENDENT_AMBULATORY_CARE_PROVIDER_SITE_OTHER): Payer: Managed Care, Other (non HMO) | Admitting: *Deleted

## 2018-06-28 DIAGNOSIS — E538 Deficiency of other specified B group vitamins: Secondary | ICD-10-CM

## 2018-06-28 MED ORDER — CYANOCOBALAMIN 1000 MCG/ML IJ SOLN
1000.0000 ug | Freq: Once | INTRAMUSCULAR | Status: AC
  Administered 2018-06-28: 1000 ug via INTRAMUSCULAR

## 2018-06-28 NOTE — Progress Notes (Signed)
Per orders of Dr. Wynonia Lawman, injection of b12  given by Desmond Dike. Patient tolerated injection well.

## 2018-08-02 ENCOUNTER — Ambulatory Visit (INDEPENDENT_AMBULATORY_CARE_PROVIDER_SITE_OTHER): Payer: Managed Care, Other (non HMO) | Admitting: *Deleted

## 2018-08-02 DIAGNOSIS — E538 Deficiency of other specified B group vitamins: Secondary | ICD-10-CM

## 2018-08-02 MED ORDER — CYANOCOBALAMIN 1000 MCG/ML IJ SOLN
1000.0000 ug | Freq: Once | INTRAMUSCULAR | Status: AC
  Administered 2018-08-02: 1000 ug via INTRAMUSCULAR

## 2018-08-02 NOTE — Progress Notes (Signed)
Per orders of Regina Baity,NP injection of Vitamin B12 given by Drayce Tawil Simpson. Patient tolerated injection well. 

## 2018-08-08 NOTE — Progress Notes (Signed)
B12 injection get a centimeter.  B12 level reviewed. Nicki Reaper, NP

## 2018-09-05 ENCOUNTER — Other Ambulatory Visit: Payer: Self-pay

## 2018-09-05 ENCOUNTER — Ambulatory Visit (INDEPENDENT_AMBULATORY_CARE_PROVIDER_SITE_OTHER): Payer: Managed Care, Other (non HMO)

## 2018-09-05 DIAGNOSIS — E538 Deficiency of other specified B group vitamins: Secondary | ICD-10-CM | POA: Diagnosis not present

## 2018-09-05 MED ORDER — CYANOCOBALAMIN 1000 MCG/ML IJ SOLN
1000.0000 ug | Freq: Once | INTRAMUSCULAR | Status: AC
  Administered 2018-09-05: 1000 ug via INTRAMUSCULAR

## 2018-09-05 NOTE — Progress Notes (Signed)
Per orders ofRegina Baity, NP-C, injection ofB12given by Auden Wettstein Y. Patient tolerated injection well. 

## 2018-09-07 NOTE — Progress Notes (Signed)
Pt due for B12 injection. Last B12 level reviewed. Valerie Hanback, NP    

## 2018-10-08 IMAGING — MG MM DIGITAL SCREENING BILAT W/ TOMO W/ CAD
8 of 14 series · 8 of 30 positions shown · non-contrast
Comparison: None.

CLINICAL DATA: Screening.

EXAM:
2D DIGITAL SCREENING BILATERAL MAMMOGRAM WITH CAD AND ADJUNCT TOMO

[L MLO (1 of 2)]
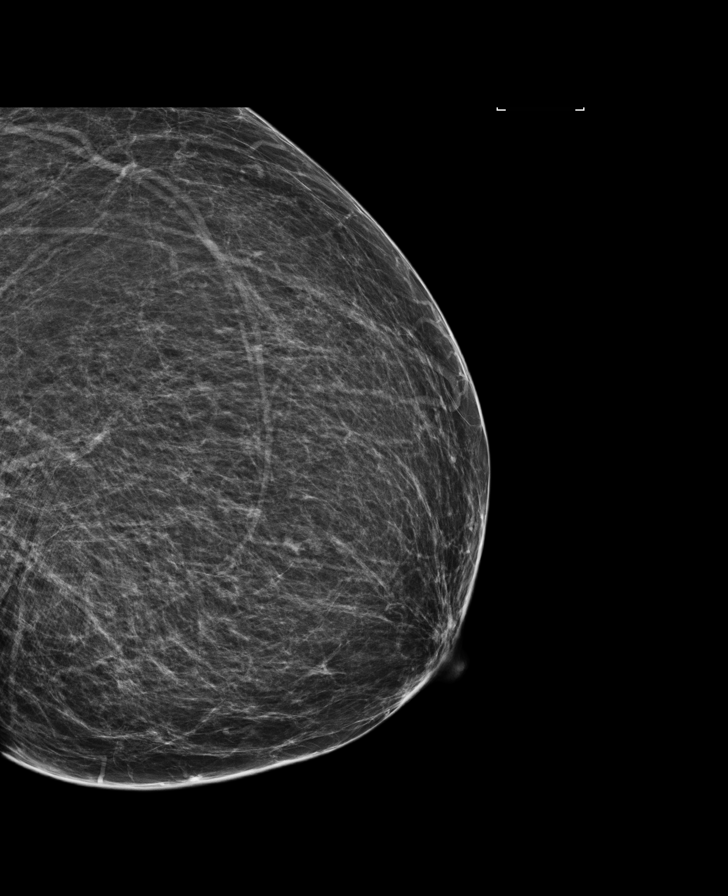

[R MLO (1 of 2)]
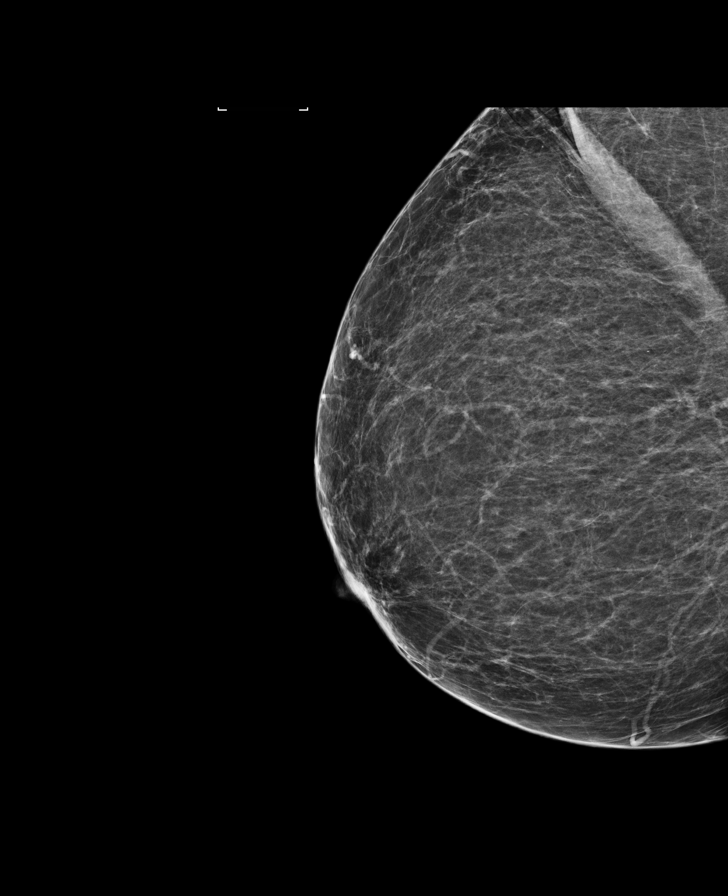

[L MLO (2 of 2)]
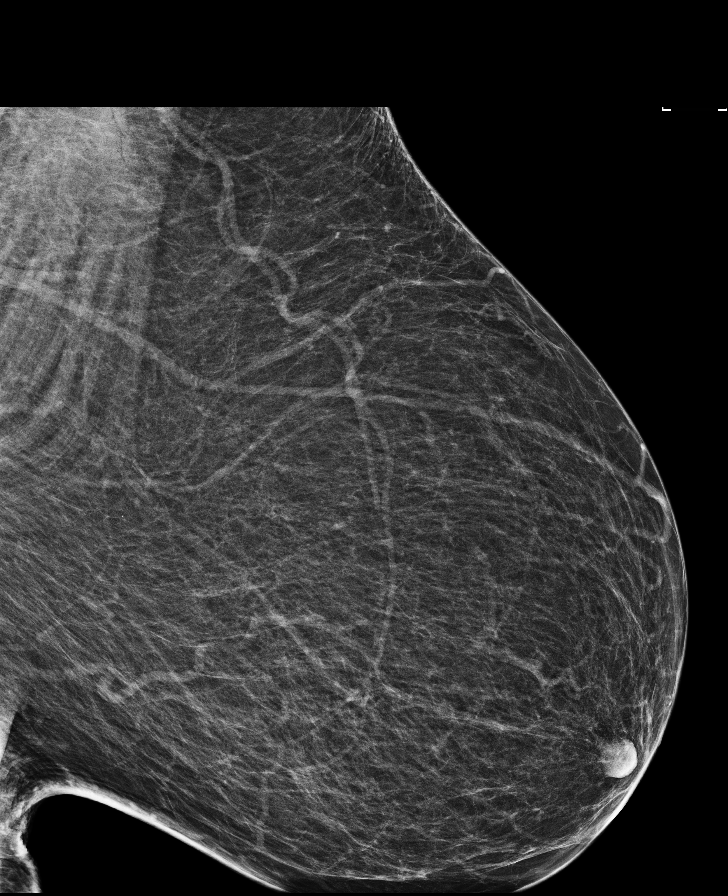

[R CC]
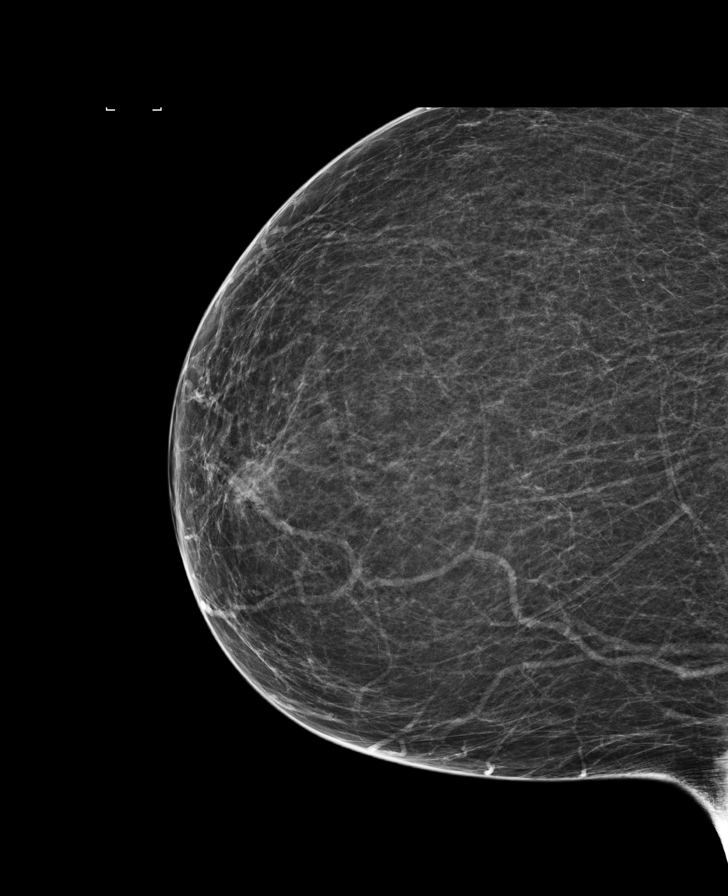

[L CC synth-2D]
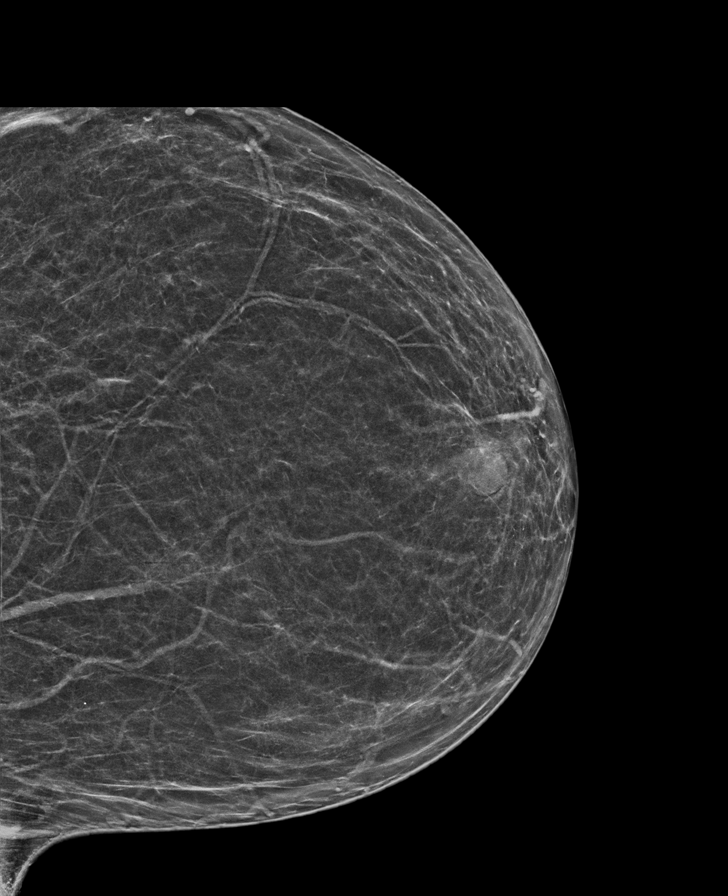

[R CC synth-2D]
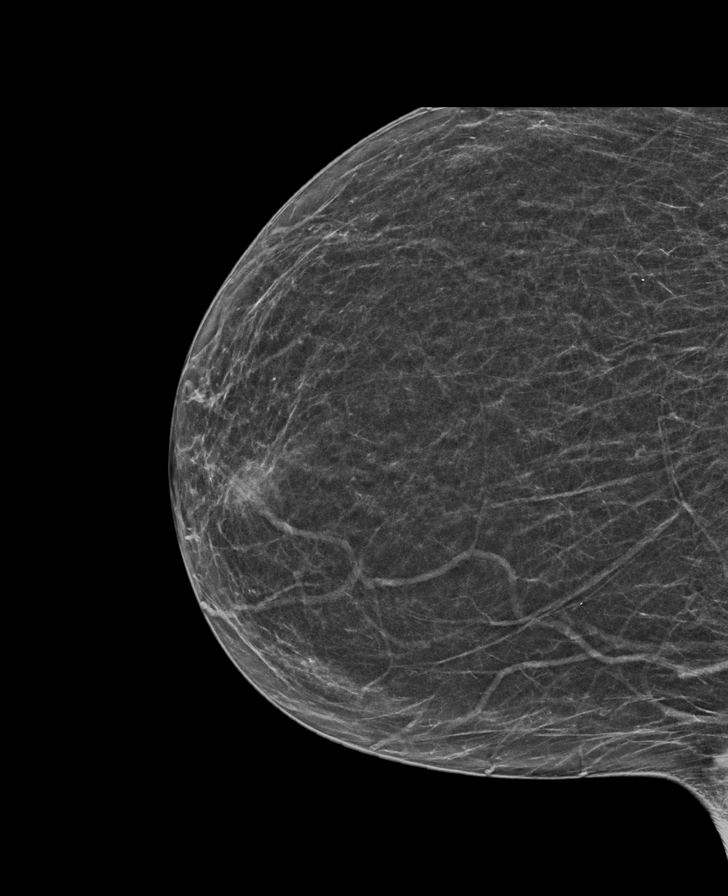

[R MLO (2 of 2)]
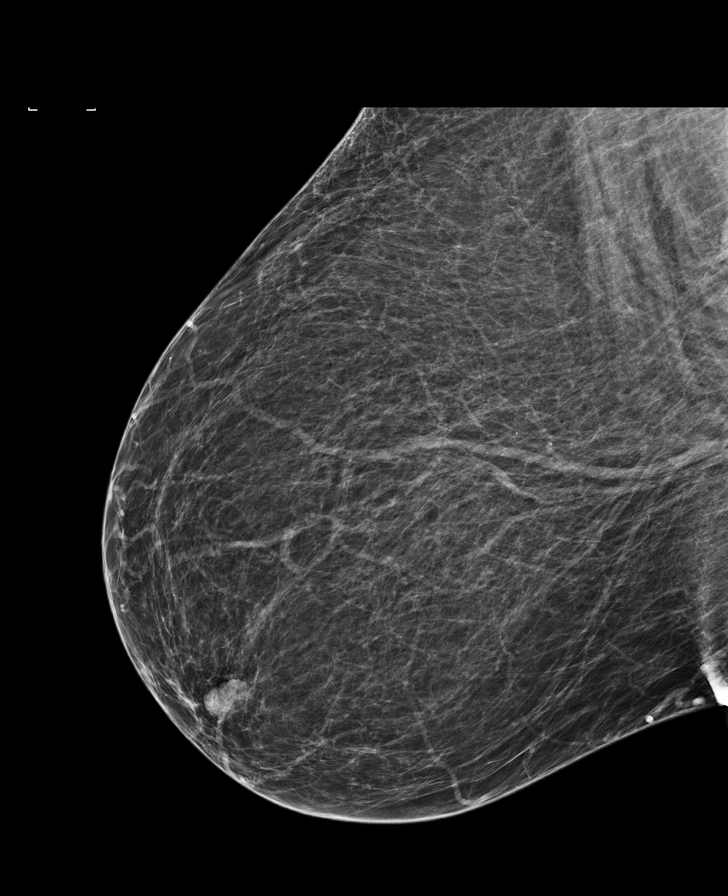

[L CC]
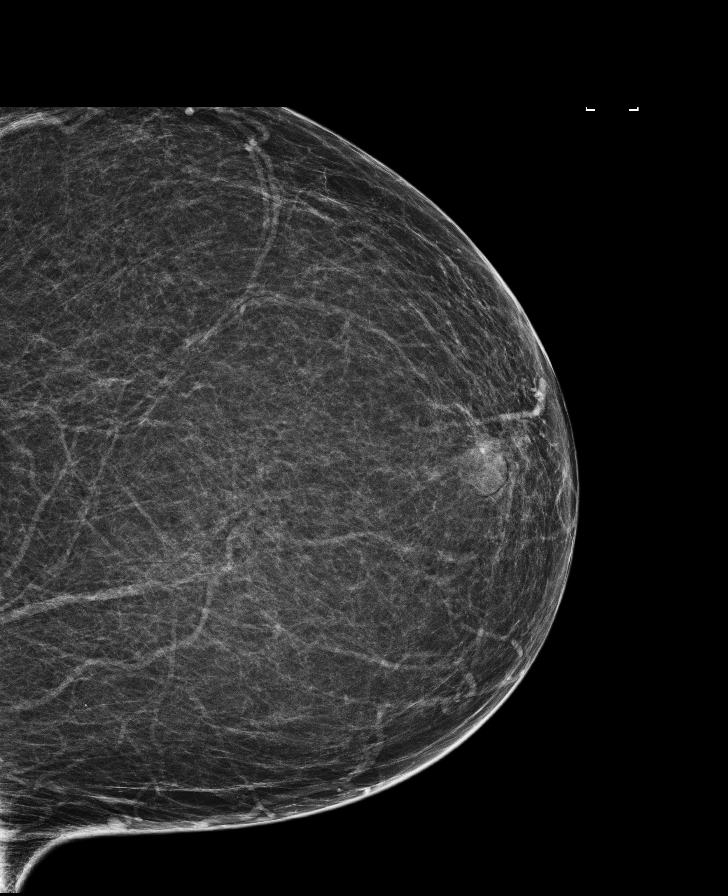

[8 of 30 positions shown; findings below may reference images not displayed]

ACR Breast Density Category b: There are scattered areas of
fibroglandular density.
FINDINGS: There are no findings suspicious for malignancy. Images were
processed with CAD.
IMPRESSION: No mammographic evidence of malignancy. A result letter of this
screening mammogram will be mailed directly to the patient.

RECOMMENDATION:
Screening mammogram in one year. (Code:[id number])

BI-RADS CATEGORY  1: Negative.

## 2018-10-11 ENCOUNTER — Telehealth: Payer: Self-pay

## 2018-10-11 ENCOUNTER — Ambulatory Visit (INDEPENDENT_AMBULATORY_CARE_PROVIDER_SITE_OTHER): Payer: Managed Care, Other (non HMO)

## 2018-10-11 ENCOUNTER — Other Ambulatory Visit: Payer: Self-pay

## 2018-10-11 DIAGNOSIS — E538 Deficiency of other specified B group vitamins: Secondary | ICD-10-CM

## 2018-10-11 MED ORDER — CYANOCOBALAMIN 1000 MCG/ML IJ SOLN
1000.0000 ug | Freq: Once | INTRAMUSCULAR | Status: AC
  Administered 2018-10-11: 1000 ug via INTRAMUSCULAR

## 2018-10-11 NOTE — Telephone Encounter (Signed)
Called patient to let her know that she needs a virtual visit for follow up with Nicki Reaper, NP. Spoke with Shawna Orleans about this yesterday-10/10/2018. Last office visit was on 03/29/2018 for peripheral edema, last routine follow up visit was on 10/31/2017. Last B12 check was in February 2019. Someone else did her B12 injection today so I was not able to talk to her in person about everything. Patient states it will be hard to do a follow up because she works 8 to 5 pm Monday through Friday. Takes a lunch from 12:30 pm to 1:30 pm usually. How would you like to proceed? Please review. CB: [phone].

## 2018-10-11 NOTE — Progress Notes (Signed)
Per orders of NP [facility], injection of Vit B12 given by Nanci Pina. Patient tolerated injection well.

## 2018-11-13 ENCOUNTER — Other Ambulatory Visit: Payer: Self-pay

## 2018-11-13 ENCOUNTER — Ambulatory Visit: Payer: Managed Care, Other (non HMO)

## 2018-11-13 NOTE — Telephone Encounter (Addendum)
Pt showed up for NV today for vit B12 inj.  No injection was administered.  I informed pt she is due for physical and past due for vit B12 labs.  Pt verbalizes understanding and will call today to schedule CPE and lab visits.   Today's NV c/x.

## 2018-11-29 ENCOUNTER — Encounter: Payer: Self-pay | Admitting: Internal Medicine

## 2018-11-29 ENCOUNTER — Other Ambulatory Visit: Payer: Self-pay

## 2018-11-29 ENCOUNTER — Ambulatory Visit (INDEPENDENT_AMBULATORY_CARE_PROVIDER_SITE_OTHER): Payer: Managed Care, Other (non HMO) | Admitting: Internal Medicine

## 2018-11-29 VITALS — BP 118/76 | HR 72 | Temp 97.9°F | Ht 64.0 in | Wt 286.0 lb

## 2018-11-29 DIAGNOSIS — M25561 Pain in right knee: Secondary | ICD-10-CM | POA: Diagnosis not present

## 2018-11-29 DIAGNOSIS — G8929 Other chronic pain: Secondary | ICD-10-CM

## 2018-11-29 DIAGNOSIS — E538 Deficiency of other specified B group vitamins: Secondary | ICD-10-CM | POA: Diagnosis not present

## 2018-11-29 DIAGNOSIS — E559 Vitamin D deficiency, unspecified: Secondary | ICD-10-CM

## 2018-11-29 DIAGNOSIS — D508 Other iron deficiency anemias: Secondary | ICD-10-CM

## 2018-11-29 DIAGNOSIS — A6004 Herpesviral vulvovaginitis: Secondary | ICD-10-CM

## 2018-11-29 DIAGNOSIS — M25562 Pain in left knee: Secondary | ICD-10-CM

## 2018-11-29 LAB — CBC WITH DIFFERENTIAL/PLATELET
Basophils Absolute: 0 10*3/uL (ref 0.0–0.1)
Basophils Relative: 0.4 % (ref 0.0–3.0)
Eosinophils Absolute: 0 10*3/uL (ref 0.0–0.7)
Eosinophils Relative: 0.5 % (ref 0.0–5.0)
HCT: 42.3 % (ref 36.0–46.0)
Hemoglobin: 13.8 g/dL (ref 12.0–15.0)
Lymphocytes Relative: 22.8 % (ref 12.0–46.0)
Lymphs Abs: 1.3 10*3/uL (ref 0.7–4.0)
MCHC: 32.6 g/dL (ref 30.0–36.0)
MCV: 84.4 fl (ref 78.0–100.0)
Monocytes Absolute: 0.5 10*3/uL (ref 0.1–1.0)
Monocytes Relative: 8.6 % (ref 3.0–12.0)
Neutro Abs: 3.8 10*3/uL (ref 1.4–7.7)
Neutrophils Relative %: 67.7 % (ref 43.0–77.0)
Platelets: 344 10*3/uL (ref 150.0–400.0)
RBC: 5.01 Mil/uL (ref 3.87–5.11)
RDW: 13.6 % (ref 11.5–15.5)
WBC: 5.6 10*3/uL (ref 4.0–10.5)

## 2018-11-29 LAB — COMPREHENSIVE METABOLIC PANEL
ALT: 11 U/L (ref 0–35)
AST: 15 U/L (ref 0–37)
Albumin: 3.9 g/dL (ref 3.5–5.2)
Alkaline Phosphatase: 99 U/L (ref 39–117)
BUN: 11 mg/dL (ref 6–23)
CO2: 31 mEq/L (ref 19–32)
Calcium: 9.1 mg/dL (ref 8.4–10.5)
Chloride: 104 mEq/L (ref 96–112)
Creatinine, Ser: 0.84 mg/dL (ref 0.40–1.20)
GFR: 84.23 mL/min (ref >60.00)
Glucose, Bld: 74 mg/dL (ref 70–99)
Potassium: 4.5 mEq/L (ref 3.5–5.1)
Sodium: 142 mEq/L (ref 135–145)
Total Bilirubin: 0.7 mg/dL (ref 0.2–1.2)
Total Protein: 6.8 g/dL (ref 6.0–8.3)

## 2018-11-29 LAB — VITAMIN B12: Vitamin B-12: 140 pg/mL — ABNORMAL LOW (ref 211–911)

## 2018-11-29 LAB — IBC PANEL
Iron: 122 ug/dL (ref 42–145)
Saturation Ratios: 37.2 % (ref 20.0–50.0)
Transferrin: 234 mg/dL (ref 212.0–360.0)

## 2018-11-29 LAB — VITAMIN D 25 HYDROXY (VIT D DEFICIENCY, FRACTURES): VITD: 22.99 ng/mL — ABNORMAL LOW (ref 30.00–100.00)

## 2018-11-29 MED ORDER — CYANOCOBALAMIN 1000 MCG/ML IJ SOLN
1000.0000 ug | Freq: Once | INTRAMUSCULAR | Status: AC
  Administered 2018-11-29: 1000 ug via INTRAMUSCULAR

## 2018-11-29 NOTE — Patient Instructions (Signed)
Vitamin B12 Deficiency Vitamin B12 deficiency means that your body is not getting enough vitamin B12. Your body needs vitamin B12 for important bodily functions. If you do not have enough vitamin B12 in your body, you can have health problems. Follow these instructions at home:  Take supplements only as told by your doctor. Follow the directions carefully.  Get any shots (injections) as told by your doctor. Do not miss your visits to the doctor.  Eat lots of healthy foods that contain vitamin B12. Ask your doctor if you should work with someone who is trained in how food affects health (dietitian). Foods that contain vitamin B12 include: ? Meat. ? Meat from birds (poultry). ? Fish. ? Eggs. ? Cereal and dairy products that are fortified. This means that vitamin B12 has been added to the food. Check the label on the package to see if the food is fortified.  Do not drink too much (do not abuse) alcohol.  Keep all follow-up visits as told by your doctor. This is important. Contact a doctor if:  Your symptoms come back. Get help right away if:  You have trouble breathing.  You have chest pain.  You get dizzy.  You pass out (lose consciousness). This information is not intended to replace advice given to you by your health care provider. Make sure you discuss any questions you have with your health care provider. Document Released: 05/26/2011 Document Revised: 11/12/2015 Document Reviewed: 10/22/2014 Elsevier Interactive Patient Education  2019 Elsevier Inc.  

## 2018-11-29 NOTE — Assessment & Plan Note (Signed)
Continue Valtrex as needed Will monitor

## 2018-11-29 NOTE — Assessment & Plan Note (Signed)
Will check B12 level today After lab, B12 1000 mcg SQ today

## 2018-11-29 NOTE — Addendum Note (Signed)
Addended by: Lurlean Nanny on: 11/29/2018 11:44 AM   Modules accepted: Orders

## 2018-11-29 NOTE — Assessment & Plan Note (Signed)
Discussed how weight loss would help improve knee pain Encouraged regular physical activity Continue Meloxicam as needed CMET today

## 2018-11-29 NOTE — Progress Notes (Signed)
Subjective:    Patient ID: Valerie Jacobs, female    DOB: [birth year], 58 y.o.   MRN: [id number]  HPI  Pt due for follow up of chronic conditions.  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 She takes Valtrex as needed with good relief of symptoms.  Vit D Deficiency: Her last Vit D was checked in 2018. She is taking Vit D OTC. She is not taking any Calcium.  Vit B12 Deficiency: She has not had her Vit B12 checked since 07/2017. She is getting B12 injections monthly for the last year. She does not feel like her energy has improved.  Bilateral Knee Pain: Xrays from 08/2014 reviewed. She takes Meloxicam as needed with good relief of symptoms.  Iron Deficiency Anemia: Her last H/H was 41.5/13.4. She is taking an iron supplement OTC. She denies constipation, s/s of bleeding.  Review of Systems      Past Medical History:  Diagnosis Date  . Chicken pox   . Genital herpes     Current Outpatient Medications  Medication Sig Dispense Refill  . acetaminophen (TYLENOL) 325 MG tablet Take 650 mg by mouth as needed.    . Cholecalciferol (VITAMIN D) 2000 units CAPS Take 1 capsule by mouth daily.    . Iron-Folic Acid-Vit B12 (IRON FORMULA PO) Take 2 tablets by mouth daily.    . meloxicam (MOBIC) 15 MG tablet TAKE ONE TABLET BY MOUTH DAILY 30 tablet 5  . valACYclovir (VALTREX) 500 MG tablet TAKE ONE TABLET BY MOUTH TWO TIMES DAILY 30 tablet 1   Current Facility-Administered Medications  Medication Dose Route Frequency Provider Last Rate Last Dose  . cyanocobalamin ((VITAMIN B-12)) injection 1,000 mcg  1,000 mcg Intramuscular Q30 days Lorre MunroeBaity,  W, NP   1,000 mcg at 10/31/17 [id number]    No Known Allergies  Family History  Problem Relation Age of Onset  . Cancer Father        Prostate  . Cancer Sister        Breast  . Hypertension Maternal Aunt   . Cancer Paternal Aunt        Breast  . Breast cancer Neg Hx     Social History   Socioeconomic History  . Marital status: Divorced    Spouse name: Not on file  . Number of children: Not on file  . Years of education: Not on file  . Highest education level: Not on file  Occupational History  . Not on file  Social Needs  . Financial resource strain: Not on file  . Food insecurity    Worry: Not on file    Inability: Not on file  . Transportation needs    Medical: Not on file    Non-medical: Not on file  Tobacco Use  . Smoking status: Never Smoker  . Smokeless tobacco: Never Used  Substance and Sexual Activity  . Alcohol use: Yes    Alcohol/week: 0.0 standard drinks    Comment: wine--occasional  . Drug use: No  . Sexual activity: Not Currently  Lifestyle  . Physical activity    Days per week: Not on file    Minutes per session: Not on file  . Stress: Not on file  Relationships  . Social Musicianconnections    Talks on phone: Not on file    Gets together: Not on file    Attends religious service: Not on file    Active member of club or organization: Not on file    Attends meetings of clubs  or organizations: Not on file    Relationship status: Not on file  . Intimate partner violence    Fear of current or ex partner: Not on file    Emotionally abused: Not on file    Physically abused: Not on file    Forced sexual activity: Not on file  Other Topics Concern  . Not on file  Social History Narrative  . Not on file     Constitutional: Pt reports fatigue. Denies fever, malaise, headache or abrupt weight changes.  HEENT: Denies eye pain, eye redness, ear pain, ringing in the ears, wax buildup, runny nose, nasal congestion, bloody nose, or sore throat. Respiratory: Denies difficulty breathing, shortness of breath, cough or sputum production.   Cardiovascular: Denies chest pain, chest tightness, palpitations or swelling in the hands or feet.  Gastrointestinal: Denies abdominal pain, bloating, constipation, diarrhea or blood in the stool.  GU: Denies urgency, frequency, pain with urination, burning sensation, blood  in urine, odor or discharge. Musculoskeletal: Pt reports intermittent knee pain. Denies decrease in range of motion, difficulty with gait, muscle pain or joint swelling.  Skin: Denies redness, rashes, lesions or ulcercations.  Neurological: Denies dizziness, difficulty with memory, difficulty with speech or problems with balance and coordination.  Psych: Denies anxiety, depression, SI/HI.  No other specific complaints in a complete review of systems (except as listed in HPI above).  Objective:   Physical Exam  BP 118/76   Pulse 72   Temp 97.9 F (36.6 C) (Oral)   Ht 5\' 4"  (1.626 m)   Wt 286 lb (129.7 kg)   SpO2 98%   BMI 49.09 kg/m  Wt Readings from Last 3 Encounters:  11/29/18 286 lb (129.7 kg)  06/26/18 289 lb (131.1 kg)  03/29/18 (!) 306 lb (138.8 kg)    General: Appears her stated age, obese, in NAD. Skin: Warm, dry and intact. No rashes, lesions or ulcerations noted. Cardiovascular: Normal rate and rhythm.  Pulmonary/Chest: Normal effort and positive vesicular breath sounds. No respiratory distress. No wheezes, rales or ronchi noted.  Musculoskeletal: No signs of joint swelling. No difficulty with gait.  Neurological: Alert and oriented.  Psychiatric: Mood and affect normal. Behavior is normal. Judgment and thought content normal.    BMET    Component Value Date/Time   NA 143 08/08/2017 0820   K 4.7 08/08/2017 0820   CL 104 08/08/2017 0820   CO2 29 10/24/2016 1500   GLUCOSE 82 06/26/2018 0945   GLUCOSE 93 10/24/2016 1500   BUN 9 08/08/2017 0820   CREATININE 0.83 08/08/2017 0820   CALCIUM 8.6 (L) 08/08/2017 0820   CALCIUM 8.7 03/14/2007 0207   GFRNONAA 79 08/08/2017 0820   GFRAA 91 08/08/2017 0820    Lipid Panel     Component Value Date/Time   CHOL 217 (H) 06/26/2018 0945   TRIG 52 06/26/2018 0945   HDL 80 06/26/2018 0945   CHOLHDL 2.8 08/08/2017 0820   CHOLHDL 3 10/24/2016 1500   VLDL 10.0 10/24/2016 1500   LDLCALC 127 (H) 06/26/2018 0945    CBC     Component Value Date/Time   WBC 4.5 08/08/2017 0820   WBC 5.9 10/24/2016 1500   RBC 4.97 08/08/2017 0820   RBC 4.67 10/24/2016 1500   HGB 14.2 08/08/2017 0820   HCT 44.2 08/08/2017 0820   PLT 340 08/08/2017 0820   MCV 89 08/08/2017 0820   MCH 28.6 08/08/2017 0820   MCHC 32.1 08/08/2017 0820   MCHC 32.4 10/24/2016 1500  RDW 14.9 08/08/2017 0820   LYMPHSABS 1.5 08/08/2017 0820   MONOABS 0.5 10/22/2015 1502   EOSABS 0.1 08/08/2017 0820   BASOSABS 0.0 08/08/2017 0820    Hgb A1C Lab Results  Component Value Date   HGBA1C 5.4 10/24/2016            Assessment & Plan:

## 2018-11-29 NOTE — Assessment & Plan Note (Signed)
CBC today She is not taking any iron supplement OTC

## 2018-12-11 ENCOUNTER — Telehealth: Payer: Self-pay

## 2018-12-11 NOTE — Telephone Encounter (Signed)
Pt left v/m requesting cb about recent lab results for Vit B12,Vit D and iron.

## 2018-12-18 NOTE — Telephone Encounter (Signed)
Pt left v/m requesting cb about how to move forward with Vit B 12 shots, Vit D and iron.

## 2018-12-18 NOTE — Telephone Encounter (Signed)
Best number cell [phone]   Work [phone] Pt called checking on lab results labs were done on 6/11 Please advise

## 2018-12-19 NOTE — Telephone Encounter (Signed)
Left detailed msg on VM per HIPAA  if pt want to schedule nurse visit for B12 she will take inj every 2 weeks x 3 months and monthly x 3 months

## 2019-01-03 ENCOUNTER — Other Ambulatory Visit: Payer: Self-pay

## 2019-01-03 ENCOUNTER — Ambulatory Visit (INDEPENDENT_AMBULATORY_CARE_PROVIDER_SITE_OTHER): Payer: Managed Care, Other (non HMO)

## 2019-01-03 DIAGNOSIS — E538 Deficiency of other specified B group vitamins: Secondary | ICD-10-CM

## 2019-01-03 MED ORDER — CYANOCOBALAMIN 1000 MCG/ML IJ SOLN: 1000.0000 ug | Freq: Once | INTRAMUSCULAR | 0 refills | Status: DC

## 2019-01-03 MED ORDER — CYANOCOBALAMIN 1000 MCG/ML IJ SOLN
1000.0000 ug | Freq: Once | INTRAMUSCULAR | Status: AC
  Administered 2019-01-03: 1000 ug via INTRAMUSCULAR

## 2019-01-03 NOTE — Progress Notes (Signed)
Per orders of Webb Silversmith, NP, injection of vitamin B12 given by Diamond Nickel, RN.  Administered to left deltoid IM.   Patient tolerated injection well.

## 2019-01-17 ENCOUNTER — Other Ambulatory Visit: Payer: Self-pay

## 2019-01-17 ENCOUNTER — Ambulatory Visit (INDEPENDENT_AMBULATORY_CARE_PROVIDER_SITE_OTHER): Payer: Managed Care, Other (non HMO)

## 2019-01-17 DIAGNOSIS — E538 Deficiency of other specified B group vitamins: Secondary | ICD-10-CM | POA: Diagnosis not present

## 2019-01-17 MED ORDER — CYANOCOBALAMIN 1000 MCG/ML IJ SOLN
1000.0000 ug | Freq: Once | INTRAMUSCULAR | Status: AC
  Administered 2019-01-17: 1000 ug via INTRAMUSCULAR

## 2019-01-17 NOTE — Progress Notes (Signed)
Pt received bi-monthly B12 injection in right deltoid. Pt tolerated well.

## 2019-01-31 ENCOUNTER — Telehealth: Payer: Self-pay

## 2019-01-31 ENCOUNTER — Other Ambulatory Visit: Payer: Self-pay

## 2019-01-31 ENCOUNTER — Ambulatory Visit (INDEPENDENT_AMBULATORY_CARE_PROVIDER_SITE_OTHER): Payer: Managed Care, Other (non HMO)

## 2019-01-31 DIAGNOSIS — E538 Deficiency of other specified B group vitamins: Secondary | ICD-10-CM

## 2019-01-31 MED ORDER — CYANOCOBALAMIN 1000 MCG/ML IJ SOLN
1000.0000 ug | Freq: Once | INTRAMUSCULAR | Status: AC
  Administered 2019-01-31: 09:00:00 1000 ug via INTRAMUSCULAR

## 2019-01-31 NOTE — Progress Notes (Signed)
Pt received bi-monthly B12 injection in left deltoid. Pt tolerated well.

## 2019-01-31 NOTE — Telephone Encounter (Signed)
[place] Night - Client Nonclinical Telephone Record AccessNurse Client [facility] Night - Client Client Site [place] Physician Webb Silversmith - NP Contact Type Call Who Is Calling Patient / Member / Family / Caregiver Caller Name Sharyah Bostwick Caller Phone Number [phone] Call Type Message Only Information Provided Reason for Call Returning a Call from the Office Initial Comment The caller is returning a call from the office. Additional Comment Call Closed By: Carlis Stable Transaction Date/Time: 01/30/2019 5:33:10 PM (ET)

## 2019-02-14 ENCOUNTER — Ambulatory Visit (INDEPENDENT_AMBULATORY_CARE_PROVIDER_SITE_OTHER): Payer: Managed Care, Other (non HMO) | Admitting: *Deleted

## 2019-02-14 ENCOUNTER — Other Ambulatory Visit: Payer: Self-pay

## 2019-02-14 DIAGNOSIS — E538 Deficiency of other specified B group vitamins: Secondary | ICD-10-CM | POA: Diagnosis not present

## 2019-02-14 MED ORDER — CYANOCOBALAMIN 1000 MCG/ML IJ SOLN
1000.0000 ug | Freq: Once | INTRAMUSCULAR | Status: AC
  Administered 2019-02-14: 09:00:00 1000 ug via INTRAMUSCULAR

## 2019-02-14 NOTE — Progress Notes (Signed)
Per orders of Webb Silversmith, NP, injection of Vit B12 1022mcg given by Mandeep Kiser M in right deltoid. Patient tolerated injection well.

## 2019-02-28 ENCOUNTER — Ambulatory Visit (INDEPENDENT_AMBULATORY_CARE_PROVIDER_SITE_OTHER): Payer: Managed Care, Other (non HMO)

## 2019-02-28 DIAGNOSIS — E538 Deficiency of other specified B group vitamins: Secondary | ICD-10-CM | POA: Diagnosis not present

## 2019-02-28 MED ORDER — CYANOCOBALAMIN 1000 MCG/ML IJ SOLN
1000.0000 ug | Freq: Once | INTRAMUSCULAR | Status: AC
  Administered 2019-02-28: 09:00:00 1000 ug via INTRAMUSCULAR

## 2019-02-28 NOTE — Progress Notes (Signed)
Per orders of Webb Silversmith, NP injection of B12 (gets it every 2 weeks right now) given by Kris Mouton. Patient tolerated injection well.

## 2019-03-13 ENCOUNTER — Other Ambulatory Visit: Payer: Self-pay

## 2019-03-14 ENCOUNTER — Encounter: Payer: Self-pay | Admitting: Internal Medicine

## 2019-03-14 ENCOUNTER — Telehealth: Payer: Self-pay | Admitting: Internal Medicine

## 2019-03-14 ENCOUNTER — Ambulatory Visit (INDEPENDENT_AMBULATORY_CARE_PROVIDER_SITE_OTHER): Payer: Managed Care, Other (non HMO)

## 2019-03-14 DIAGNOSIS — E538 Deficiency of other specified B group vitamins: Secondary | ICD-10-CM | POA: Diagnosis not present

## 2019-03-14 DIAGNOSIS — Z23 Encounter for immunization: Secondary | ICD-10-CM | POA: Diagnosis not present

## 2019-03-14 MED ORDER — CYANOCOBALAMIN 1000 MCG/ML IJ SOLN
1000.0000 ug | Freq: Once | INTRAMUSCULAR | Status: AC
  Administered 2019-03-14: 1000 ug via INTRAMUSCULAR

## 2019-03-14 NOTE — Progress Notes (Addendum)
Per orders of Webb Silversmith, NP injection of B12 given by Kris Mouton. Patient tolerated injection well.  This was the last time patient needed to get B12 every 2 weeks. Patient is aware to come in monthly now for B12 starting in October. Patient was to call and make an appointment. Patient also received her Flu vaccine today.

## 2019-03-14 NOTE — Telephone Encounter (Signed)
Patient had a flu shot done today.  Patient would like documentation that she had the flu shot to send to her work.  Patient said documentation can be mailed to her.

## 2019-03-14 NOTE — Telephone Encounter (Signed)
Note mailed to the patient and patient advised.

## 2019-03-28 ENCOUNTER — Other Ambulatory Visit: Payer: Self-pay

## 2019-03-28 ENCOUNTER — Ambulatory Visit: Payer: Managed Care, Other (non HMO) | Admitting: Internal Medicine

## 2019-03-28 ENCOUNTER — Encounter: Payer: Self-pay | Admitting: Internal Medicine

## 2019-03-28 VITALS — BP 120/78 | HR 60 | Temp 98.0°F | Wt 282.0 lb

## 2019-03-28 DIAGNOSIS — Z113 Encounter for screening for infections with a predominantly sexual mode of transmission: Secondary | ICD-10-CM | POA: Diagnosis not present

## 2019-03-28 DIAGNOSIS — Z9189 Other specified personal risk factors, not elsewhere classified: Secondary | ICD-10-CM

## 2019-03-28 NOTE — Progress Notes (Signed)
Subjective:    Patient ID: Valerie Jacobs, female    DOB: 02-Jan-1961, 58 y.o.   MRN: [id number]  HPI  Pt presents to the clinic today for STD screening. She reports she found out that her partner has been unfaithful. She is currently not having any symptoms. She denies pelvic pain, vaginal discharge, odor, abnormal bleeding, pain with intercourse. She denies urinary urgency, frequency, dysuria or blood in her urine. She is currently seeing a therapist through her employer to help her deal with this situation.  Review of Systems      Past Medical History:  Diagnosis Date  . Chicken pox   . Genital herpes     Current Outpatient Medications  Medication Sig Dispense Refill  . acetaminophen (TYLENOL) 325 MG tablet Take 650 mg by mouth as needed.    . Cholecalciferol (VITAMIN D) 2000 units CAPS Take 1 capsule by mouth daily.    . cyanocobalamin (,VITAMIN B-12,) 1000 MCG/ML injection Inject 1,000 mcg into the muscle every 30 (thirty) days. Patient is to receive injections every 2 weeks X 3 months (start 7/16), then take monthly X 3 months before rechecking.    . Iron-Folic Acid-Vit R51 (IRON FORMULA PO) Take 2 tablets by mouth daily.    . meloxicam (MOBIC) 15 MG tablet TAKE ONE TABLET BY MOUTH DAILY (Patient taking differently: Take 15 mg by mouth as needed. ) 30 tablet 5  . valACYclovir (VALTREX) 500 MG tablet TAKE ONE TABLET BY MOUTH TWO TIMES DAILY 30 tablet 1   No current facility-administered medications for this visit.     No Known Allergies  Family History  Problem Relation Age of Onset  . Cancer Father        Prostate  . Cancer Sister        Breast  . Hypertension Maternal Aunt   . Cancer Paternal Aunt        Breast  . Breast cancer Neg Hx     Social History   Socioeconomic History  . Marital status: Divorced    Spouse name: Not on file  . Number of children: Not on file  . Years of education: Not on file  . Highest education level: Not on file  Occupational  History  . Not on file  Social Needs  . Financial resource strain: Not on file  . Food insecurity    Worry: Not on file    Inability: Not on file  . Transportation needs    Medical: Not on file    Non-medical: Not on file  Tobacco Use  . Smoking status: Never Smoker  . Smokeless tobacco: Never Used  Substance and Sexual Activity  . Alcohol use: Yes    Alcohol/week: 0.0 standard drinks    Comment: wine--occasional  . Drug use: No  . Sexual activity: Not Currently  Lifestyle  . Physical activity    Days per week: Not on file    Minutes per session: Not on file  . Stress: Not on file  Relationships  . Social Herbalist on phone: Not on file    Gets together: Not on file    Attends religious service: Not on file    Active member of club or organization: Not on file    Attends meetings of clubs or organizations: Not on file    Relationship status: Not on file  . Intimate partner violence    Fear of current or ex partner: Not on file  Emotionally abused: Not on file    Physically abused: Not on file    Forced sexual activity: Not on file  Other Topics Concern  . Not on file  Social History Narrative  . Not on file     Constitutional: Denies fever, malaise, fatigue, headache or abrupt weight changes.  Respiratory: Denies difficulty breathing, shortness of breath, cough or sputum production.   Cardiovascular: Denies chest pain, chest tightness, palpitations or swelling in the hands or feet.  Gastrointestinal: Denies abdominal pain, bloating, constipation, diarrhea or blood in the stool.  GU: Denies urgency, frequency, pain with urination, burning sensation, blood in urine, odor or discharge. Skin: Denies redness, rashes, lesions or ulcercations.   No other specific complaints in a complete review of systems (except as listed in HPI above).  Objective:   Physical Exam  BP 120/78   Pulse 60   Temp 98 F (36.7 C) (Temporal)   Wt 282 lb (127.9 kg)   SpO2  98%   BMI 48.41 kg/m  Wt Readings from Last 3 Encounters:  03/28/19 282 lb (127.9 kg)  11/29/18 286 lb (129.7 kg)  06/26/18 289 lb (131.1 kg)    General: Appears her stated age, obese, in NAD. Cardiovascular: Normal rate and rhythm.  Pulmonary/Chest: Normal effort and positive vesicular breath sounds. No respiratory distress. No wheezes, rales or ronchi noted.  Abdomen: Soft and nontender. Normal bowel sounds. No distention or masses noted.   Neurological: Alert and oriented.   BMET    Component Value Date/Time   NA 142 11/29/2018 0928   NA 143 08/08/2017 0820   K 4.5 11/29/2018 0928   CL 104 11/29/2018 0928   CO2 31 11/29/2018 0928   GLUCOSE 74 11/29/2018 0928   BUN 11 11/29/2018 0928   BUN 9 08/08/2017 0820   CREATININE 0.84 11/29/2018 0928   CALCIUM 9.1 11/29/2018 0928   CALCIUM 8.7 03/14/2007 0207   GFRNONAA 79 08/08/2017 0820   GFRAA 91 08/08/2017 0820    Lipid Panel     Component Value Date/Time   CHOL 217 (H) 06/26/2018 0945   TRIG 52 06/26/2018 0945   HDL 80 06/26/2018 0945   CHOLHDL 2.8 08/08/2017 0820   CHOLHDL 3 10/24/2016 1500   VLDL 10.0 10/24/2016 1500   LDLCALC 127 (H) 06/26/2018 0945    CBC    Component Value Date/Time   WBC 5.6 11/29/2018 0928   RBC 5.01 11/29/2018 0928   HGB 13.8 11/29/2018 0928   HGB 14.2 08/08/2017 0820   HCT 42.3 11/29/2018 0928   HCT 44.2 08/08/2017 0820   PLT 344.0 11/29/2018 0928   PLT 340 08/08/2017 0820   MCV 84.4 11/29/2018 0928   MCV 89 08/08/2017 0820   MCH 28.6 08/08/2017 0820   MCHC 32.6 11/29/2018 0928   RDW 13.6 11/29/2018 0928   RDW 14.9 08/08/2017 0820   LYMPHSABS 1.3 11/29/2018 0928   LYMPHSABS 1.5 08/08/2017 0820   MONOABS 0.5 11/29/2018 0928   EOSABS 0.0 11/29/2018 0928   EOSABS 0.1 08/08/2017 0820   BASOSABS 0.0 11/29/2018 0928   BASOSABS 0.0 08/08/2017 0820    Hgb A1C Lab Results  Component Value Date   HGBA1C 5.4 10/24/2016            Assessment & Plan:   Screen for STD:   Will check urine gonorrhea, chlamydia and trichomonis Will check HIV, RPR and HSV  Will follow up after labs are back, return precautions discussed Nicki Reaper, NP

## 2019-03-28 NOTE — Patient Instructions (Signed)

## 2019-03-29 LAB — HIV ANTIBODY (ROUTINE TESTING W REFLEX): HIV 1&2 Ab, 4th Generation: NONREACTIVE

## 2019-03-29 LAB — C. TRACHOMATIS/N. GONORRHOEAE RNA
C. trachomatis RNA, TMA: NOT DETECTED
N. gonorrhoeae RNA, TMA: NOT DETECTED

## 2019-03-29 LAB — RPR: RPR Ser Ql: NONREACTIVE

## 2019-03-29 LAB — TRICHOMONAS VAGINALIS, PROBE AMP: Trichomonas vaginalis RNA: NOT DETECTED

## 2019-04-02 ENCOUNTER — Other Ambulatory Visit: Payer: Self-pay

## 2019-04-02 DIAGNOSIS — Z20822 Contact with and (suspected) exposure to covid-19: Secondary | ICD-10-CM

## 2019-04-04 LAB — NOVEL CORONAVIRUS, NAA: SARS-CoV-2, NAA: NOT DETECTED

## 2019-04-10 ENCOUNTER — Telehealth: Payer: Self-pay | Admitting: Internal Medicine

## 2019-04-10 NOTE — Telephone Encounter (Signed)
Patient said she had labs done for venereal disease.  The labs came back fine.  Patient said since then she started feeling a burning sensation when she urinates and it feel like there's something there.  Patient wants to know if she can come back in, for more lab work.

## 2019-04-11 NOTE — Telephone Encounter (Signed)
Pt has appt scheduled for 04/15/2019

## 2019-04-11 NOTE — Telephone Encounter (Signed)
She can come in for a pelvic exam. Lab workup was already negative

## 2019-04-15 ENCOUNTER — Ambulatory Visit: Payer: Managed Care, Other (non HMO) | Admitting: Internal Medicine

## 2019-04-17 ENCOUNTER — Ambulatory Visit: Payer: Managed Care, Other (non HMO)

## 2019-04-18 ENCOUNTER — Other Ambulatory Visit: Payer: Self-pay

## 2019-04-18 ENCOUNTER — Ambulatory Visit (INDEPENDENT_AMBULATORY_CARE_PROVIDER_SITE_OTHER): Payer: Managed Care, Other (non HMO) | Admitting: *Deleted

## 2019-04-18 DIAGNOSIS — E538 Deficiency of other specified B group vitamins: Secondary | ICD-10-CM

## 2019-04-18 MED ORDER — CYANOCOBALAMIN 1000 MCG/ML IJ SOLN
1000.0000 ug | Freq: Once | INTRAMUSCULAR | Status: AC
  Administered 2019-04-18: 1000 ug via INTRAMUSCULAR

## 2019-04-18 NOTE — Progress Notes (Signed)
Per orders of Regina Baity, NP, injection of B12 1000 mcg/mL given by Arber Wiemers. Patient tolerated injection well.  

## 2019-05-22 ENCOUNTER — Ambulatory Visit (INDEPENDENT_AMBULATORY_CARE_PROVIDER_SITE_OTHER): Payer: Managed Care, Other (non HMO)

## 2019-05-22 ENCOUNTER — Other Ambulatory Visit: Payer: Self-pay

## 2019-05-22 DIAGNOSIS — E538 Deficiency of other specified B group vitamins: Secondary | ICD-10-CM | POA: Diagnosis not present

## 2019-05-22 MED ORDER — CYANOCOBALAMIN 1000 MCG/ML IJ SOLN
1000.0000 ug | Freq: Once | INTRAMUSCULAR | Status: AC
  Administered 2019-05-22: 1000 ug via INTRAMUSCULAR

## 2019-05-22 NOTE — Progress Notes (Signed)
Pt received 2nd monthly B12 in Right Deltoid. Tolerated well.

## 2019-06-25 ENCOUNTER — Other Ambulatory Visit: Payer: Self-pay | Admitting: Internal Medicine

## 2019-06-25 ENCOUNTER — Other Ambulatory Visit: Payer: Self-pay

## 2019-06-25 ENCOUNTER — Other Ambulatory Visit (INDEPENDENT_AMBULATORY_CARE_PROVIDER_SITE_OTHER): Payer: Managed Care, Other (non HMO)

## 2019-06-25 ENCOUNTER — Ambulatory Visit (INDEPENDENT_AMBULATORY_CARE_PROVIDER_SITE_OTHER): Payer: Managed Care, Other (non HMO)

## 2019-06-25 DIAGNOSIS — E538 Deficiency of other specified B group vitamins: Secondary | ICD-10-CM

## 2019-06-25 LAB — VITAMIN B12: Vitamin B-12: 287 pg/mL (ref 211–911)

## 2019-06-25 MED ORDER — CYANOCOBALAMIN 1000 MCG/ML IJ SOLN
1000.0000 ug | Freq: Once | INTRAMUSCULAR | Status: AC
  Administered 2019-06-25: 11:00:00 1000 ug via INTRAMUSCULAR

## 2019-06-25 NOTE — Progress Notes (Signed)
Per orders of Regina Baity, NP injection of monthly B12 given by Corlette Ciano V Millie Shorb. Patient tolerated injection well. 

## 2019-07-01 ENCOUNTER — Telehealth: Payer: Self-pay | Admitting: Internal Medicine

## 2019-07-01 NOTE — Telephone Encounter (Signed)
Patient called in regards to her b12 results  She has not heard back about her results and would like to know if those have come back and what she should do next

## 2019-07-23 ENCOUNTER — Other Ambulatory Visit: Payer: Self-pay | Admitting: Internal Medicine

## 2019-07-23 DIAGNOSIS — Z0000 Encounter for general adult medical examination without abnormal findings: Secondary | ICD-10-CM

## 2019-07-25 ENCOUNTER — Ambulatory Visit (INDEPENDENT_AMBULATORY_CARE_PROVIDER_SITE_OTHER): Payer: Managed Care, Other (non HMO) | Admitting: *Deleted

## 2019-07-25 ENCOUNTER — Other Ambulatory Visit: Payer: Self-pay

## 2019-07-25 DIAGNOSIS — E538 Deficiency of other specified B group vitamins: Secondary | ICD-10-CM | POA: Diagnosis not present

## 2019-07-25 MED ORDER — CYANOCOBALAMIN 1000 MCG/ML IJ SOLN
1000.0000 ug | Freq: Once | INTRAMUSCULAR | Status: AC
  Administered 2019-07-25: 09:00:00 1000 ug via INTRAMUSCULAR

## 2019-07-25 NOTE — Progress Notes (Signed)
B12 injection given by CMA. B12 level from 06/25/19 reviewed. Continue monthly injections. Repeat B12 level 7/21 prior to B12 injection. Nicki Reaper, NP

## 2019-07-25 NOTE — Progress Notes (Signed)
Per orders of Regina Baity,NP injection of Vitamin B12 given by Kayde Warehime Simpson. Patient tolerated injection well. 

## 2019-08-28 ENCOUNTER — Ambulatory Visit: Payer: Managed Care, Other (non HMO)

## 2019-09-02 ENCOUNTER — Other Ambulatory Visit: Payer: Self-pay | Admitting: Internal Medicine

## 2019-09-02 DIAGNOSIS — Z0000 Encounter for general adult medical examination without abnormal findings: Secondary | ICD-10-CM

## 2019-09-03 ENCOUNTER — Ambulatory Visit (INDEPENDENT_AMBULATORY_CARE_PROVIDER_SITE_OTHER): Payer: Managed Care, Other (non HMO) | Admitting: Internal Medicine

## 2019-09-03 ENCOUNTER — Other Ambulatory Visit: Payer: Self-pay

## 2019-09-03 ENCOUNTER — Encounter: Payer: Self-pay | Admitting: Internal Medicine

## 2019-09-03 VITALS — BP 122/80 | HR 72 | Temp 97.6°F | Ht 64.0 in | Wt 290.0 lb

## 2019-09-03 DIAGNOSIS — Z0000 Encounter for general adult medical examination without abnormal findings: Secondary | ICD-10-CM

## 2019-09-03 DIAGNOSIS — M25561 Pain in right knee: Secondary | ICD-10-CM

## 2019-09-03 DIAGNOSIS — G8929 Other chronic pain: Secondary | ICD-10-CM

## 2019-09-03 DIAGNOSIS — D508 Other iron deficiency anemias: Secondary | ICD-10-CM

## 2019-09-03 DIAGNOSIS — M25562 Pain in left knee: Secondary | ICD-10-CM | POA: Diagnosis not present

## 2019-09-03 DIAGNOSIS — E538 Deficiency of other specified B group vitamins: Secondary | ICD-10-CM | POA: Diagnosis not present

## 2019-09-03 DIAGNOSIS — A6004 Herpesviral vulvovaginitis: Secondary | ICD-10-CM | POA: Diagnosis not present

## 2019-09-03 MED ORDER — CYANOCOBALAMIN 1000 MCG/ML IJ SOLN
1000.0000 ug | Freq: Once | INTRAMUSCULAR | Status: AC
  Administered 2019-09-03: 1000 ug via INTRAMUSCULAR

## 2019-09-03 NOTE — Progress Notes (Signed)
Subjective:    Patient ID: Valerie Jacobs, female    DOB: 11-22-1960, 59 y.o.   MRN: [id number]  HPI  Pt presents to the clinic today for her annual. She is also due to follow up chronic conditions.   Iron Deficiency Anemia: Her last H/H was 13.8/42.3, 11/2018. She is taking an iron supplement as prescribed.   Genital Herpes: She has a Rx for Valtrex but reports she has not needed this.  She would like to be referred today to see if she actually has herpes as she has not had an outbreak in some time.  Bilateral Knee Pain: Better lately.  Managed on Meloxicam as needed.  B12 Deficiency: She has been getting B12 injections but does not notice a change in her energy level.  Flu: 02/2019 Tetanus: 2017 Pap Smear: 10/2016 Mammogram: 01/2017 Colon Screening: 2015, 10 years Vision Screening: annually Dentist: [email or web address]  Diet: She does eat meat. She consumes fruits and veggies daily. She does eat some fried foods. She drinks water, Mt. Dew, wine. Exercise: None  Review of Systems      Past Medical History:  Diagnosis Date  . Chicken pox   . Genital herpes     Current Outpatient Medications  Medication Sig Dispense Refill  . acetaminophen (TYLENOL) 325 MG tablet Take 650 mg by mouth as needed.    . Cholecalciferol (VITAMIN D) 2000 units CAPS Take 1 capsule by mouth daily.    . cyanocobalamin (,VITAMIN B-12,) 1000 MCG/ML injection Inject 1,000 mcg into the muscle every 30 (thirty) days. Patient is to receive injections every 2 weeks X 3 months (start 7/16), then take monthly X 3 months before rechecking.    . Iron-Folic Acid-Vit I71 (IRON FORMULA PO) Take 2 tablets by mouth daily.    . meloxicam (MOBIC) 15 MG tablet TAKE ONE TABLET BY MOUTH DAILY 30 tablet 0  . valACYclovir (VALTREX) 500 MG tablet TAKE ONE TABLET BY MOUTH TWO TIMES DAILY 30 tablet 0   No current facility-administered medications for this visit.    No Known Allergies  Family History  Problem Relation Age of  Onset  . Cancer Father        Prostate  . Cancer Sister        Breast  . Hypertension Maternal Aunt   . Cancer Paternal Aunt        Breast  . Breast cancer Neg Hx     Social History   Socioeconomic History  . Marital status: Divorced    Spouse name: Not on file  . Number of children: Not on file  . Years of education: Not on file  . Highest education level: Not on file  Occupational History  . Not on file  Tobacco Use  . Smoking status: Never Smoker  . Smokeless tobacco: Never Used  Substance and Sexual Activity  . Alcohol use: Yes    Alcohol/week: 0.0 standard drinks    Comment: wine--occasional  . Drug use: No  . Sexual activity: Not Currently  Other Topics Concern  . Not on file  Social History Narrative  . Not on file   Social Determinants of Health   Financial Resource Strain:   . Difficulty of Paying Living Expenses:   Food Insecurity:   . Worried About Charity fundraiser in the Last Year:   . Arboriculturist in the Last Year:   Transportation Needs:   . Film/video editor (Medical):   Marland Kitchen Lack of Transportation (Non-Medical):  Physical Activity:   . Days of Exercise per Week:   . Minutes of Exercise per Session:   Stress:   . Feeling of Stress :   Social Connections:   . Frequency of Communication with Friends and Family:   . Frequency of Social Gatherings with Friends and Family:   . Attends Religious Services:   . Active Member of Clubs or Organizations:   . Attends Archivist Meetings:   Marland Kitchen Marital Status:   Intimate Partner Violence:   . Fear of Current or Ex-Partner:   . Emotionally Abused:   Marland Kitchen Physically Abused:   . Sexually Abused:      Constitutional: Denies fever, malaise, fatigue, headache or abrupt weight changes.  HEENT: Denies eye pain, eye redness, ear pain, ringing in the ears, wax buildup, runny nose, nasal congestion, bloody nose, or sore throat. Respiratory: Denies difficulty breathing, shortness of breath, cough  or sputum production.   Cardiovascular: Denies chest pain, chest tightness, palpitations or swelling in the hands or feet.  Gastrointestinal: Denies abdominal pain, bloating, constipation, diarrhea or blood in the stool.  GU: Denies urgency, frequency, pain with urination, burning sensation, blood in urine, odor or discharge. Musculoskeletal: Patient reports intermittent knee pain.  Denies decrease in range of motion, difficulty with gait, muscle pain or joint swelling.  Skin: Denies redness, rashes, lesions or ulcercations.  Neurological: Denies dizziness, difficulty with memory, difficulty with speech or problems with balance and coordination.  Psych: Denies anxiety, depression, SI/HI.  No other specific complaints in a complete review of systems (except as listed in HPI above).  Objective:   Physical Exam  BP 122/80   Pulse 72   Temp 97.6 F (36.4 C) (Temporal)   Ht _0  (1.626 m)   Wt 290 lb (131.5 kg)   SpO2 98%   BMI 49.78 kg/m   Wt Readings from Last 3 Encounters:  03/28/19 282 lb (127.9 kg)  11/29/18 286 lb (129.7 kg)  06/26/18 289 lb (131.1 kg)    General: Appears her stated age, obese, in NAD. Skin: Warm, dry and intact. No rashes noted. HEENT: Head: normal shape and size; Eyes: sclera white, no icterus, conjunctiva pink, PERRLA and EOMs intact;  Neck:  Neck supple, trachea midline. No masses, lumps or thyromegaly present.  Cardiovascular: Normal rate and rhythm. S1,S2 noted.  No murmur, rubs or gallops noted. No JVD or BLE edema. No carotid bruits noted. Pulmonary/Chest: Normal effort and positive vesicular breath sounds. No respiratory distress. No wheezes, rales or ronchi noted.  Abdomen: Soft and nontender. Normal bowel sounds. No distention or masses noted. Liver, spleen and kidneys non palpable. Musculoskeletal: Strength 5/5 BUE/BLE.  No difficulty with gait.  Neurological: Alert and oriented. Cranial nerves II-XII grossly intact. Coordination normal.    Psychiatric: Mood and affect normal. Behavior is normal. Judgment and thought content normal.    BMET    Component Value Date/Time   NA 142 11/29/2018 0928   NA 143 08/08/2017 0820   K 4.5 11/29/2018 0928   CL 104 11/29/2018 0928   CO2 31 11/29/2018 0928   GLUCOSE 74 11/29/2018 0928   BUN 11 11/29/2018 0928   BUN 9 08/08/2017 0820   CREATININE 0.84 11/29/2018 0928   CALCIUM 9.1 11/29/2018 0928   CALCIUM 8.7 03/14/2007 0207   GFRNONAA 79 08/08/2017 0820   GFRAA 91 08/08/2017 0820    Lipid Panel     Component Value Date/Time   CHOL 217 (H) 06/26/2018 0945   TRIG 52  06/26/2018 0945   HDL 80 06/26/2018 0945   CHOLHDL 2.8 08/08/2017 0820   CHOLHDL 3 10/24/2016 1500   VLDL 10.0 10/24/2016 1500   LDLCALC 127 (H) 06/26/2018 0945    CBC    Component Value Date/Time   WBC 5.6 11/29/2018 0928   RBC 5.01 11/29/2018 0928   HGB 13.8 11/29/2018 0928   HGB 14.2 08/08/2017 0820   HCT 42.3 11/29/2018 0928   HCT 44.2 08/08/2017 0820   PLT 344.0 11/29/2018 0928   PLT 340 08/08/2017 0820   MCV 84.4 11/29/2018 0928   MCV 89 08/08/2017 0820   MCH 28.6 08/08/2017 0820   MCHC 32.6 11/29/2018 0928   RDW 13.6 11/29/2018 0928   RDW 14.9 08/08/2017 0820   LYMPHSABS 1.3 11/29/2018 0928   LYMPHSABS 1.5 08/08/2017 0820   MONOABS 0.5 11/29/2018 0928   EOSABS 0.0 11/29/2018 0928   EOSABS 0.1 08/08/2017 0820   BASOSABS 0.0 11/29/2018 0928   BASOSABS 0.0 08/08/2017 0820    Hgb A1C Lab Results  Component Value Date   HGBA1C 5.4 10/24/2016           Assessment & Plan:   Preventative health maintenance:  Flu shot up-to-date Tetanus UTD Pap smear UTD She will call to schedule her mammogram Colon screening UTD Encouraged her to consume a balanced diet and exercise regimen Advised her to see an eye doctor and dentist annually We will check CBC, C met, lipid, A1c, vitamin D B12 today  RTC in 1 year, sooner if needed  Webb Silversmith, NP This visit occurred during the  SARS-CoV-2 public health emergency.  Safety protocols were in place, including screening questions prior to the visit, additional usage of staff PPE, and extensive cleaning of exam room while observing appropriate contact time as indicated for disinfecting solutions.

## 2019-09-04 LAB — COMPREHENSIVE METABOLIC PANEL
ALT: 10 U/L (ref 0–35)
AST: 19 U/L (ref 0–37)
Albumin: 3.9 g/dL (ref 3.5–5.2)
Alkaline Phosphatase: 90 U/L (ref 39–117)
BUN: 13 mg/dL (ref 6–23)
CO2: 30 mEq/L (ref 19–32)
Calcium: 9.1 mg/dL (ref 8.4–10.5)
Chloride: 103 mEq/L (ref 96–112)
Creatinine, Ser: 0.79 mg/dL (ref 0.40–1.20)
GFR: 90.17 mL/min (ref >60.00)
Glucose, Bld: 95 mg/dL (ref 70–99)
Potassium: 4.6 mEq/L (ref 3.5–5.1)
Sodium: 140 mEq/L (ref 135–145)
Total Bilirubin: 0.4 mg/dL (ref 0.2–1.2)
Total Protein: 7.4 g/dL (ref 6.0–8.3)

## 2019-09-04 LAB — LIPID PANEL
Cholesterol: 183 mg/dL (ref 0–200)
HDL: 69.6 mg/dL (ref >39.00)
LDL Cholesterol: 101 mg/dL — ABNORMAL HIGH (ref 0–99)
NonHDL: 113.05
Total CHOL/HDL Ratio: 3
Triglycerides: 58 mg/dL (ref 0.0–149.0)
VLDL: 11.6 mg/dL (ref 0.0–40.0)

## 2019-09-04 LAB — CBC
HCT: 40.8 % (ref 36.0–46.0)
Hemoglobin: 13.3 g/dL (ref 12.0–15.0)
MCHC: 32.7 g/dL (ref 30.0–36.0)
MCV: 84.5 fl (ref 78.0–100.0)
Platelets: 311 10*3/uL (ref 150.0–400.0)
RBC: 4.83 Mil/uL (ref 3.87–5.11)
RDW: 13.7 % (ref 11.5–15.5)
WBC: 6.7 10*3/uL (ref 4.0–10.5)

## 2019-09-04 LAB — VITAMIN B12: Vitamin B-12: 711 pg/mL (ref 211–911)

## 2019-09-04 LAB — VITAMIN D 25 HYDROXY (VIT D DEFICIENCY, FRACTURES): VITD: 26.06 ng/mL — ABNORMAL LOW (ref 30.00–100.00)

## 2019-09-04 LAB — HEMOGLOBIN A1C: Hgb A1c MFr Bld: 5.1 % (ref 4.6–6.5)

## 2019-09-04 NOTE — Assessment & Plan Note (Signed)
CBC today Continue oral iron 

## 2019-09-04 NOTE — Assessment & Plan Note (Signed)
Encourage regular physical activity Continue Meloxicam as needed C met today

## 2019-09-04 NOTE — Assessment & Plan Note (Signed)
B12 level today We will consider stopping injections and trying oral B12 although she has had a gastric bypass and absorption may be impaired

## 2019-09-04 NOTE — Assessment & Plan Note (Signed)
We will check HSV IgG and IgM 1 and 2 Continue Valtrex as needed

## 2019-09-04 NOTE — Patient Instructions (Signed)

## 2019-09-06 LAB — HSV(HERPES SIMPLEX VRS) I + II AB-IGG
HAV 1 IGG,TYPE SPECIFIC AB: <0.9 index
HSV 2 IGG,TYPE SPECIFIC AB: >23 index — ABNORMAL HIGH

## 2019-09-06 LAB — HSV 1/2 AB (IGM), IFA W/RFLX TITER
HSV 1 IgM Screen: NEGATIVE
HSV 2 IgM Screen: NEGATIVE

## 2019-10-28 ENCOUNTER — Ambulatory Visit: Payer: Managed Care, Other (non HMO) | Admitting: Internal Medicine

## 2019-10-28 ENCOUNTER — Other Ambulatory Visit: Payer: Self-pay

## 2019-10-28 ENCOUNTER — Encounter: Payer: Self-pay | Admitting: Internal Medicine

## 2019-10-28 VITALS — BP 152/84 | HR 71 | Temp 97.3°F | Ht 64.0 in | Wt 288.0 lb

## 2019-10-28 DIAGNOSIS — R609 Edema, unspecified: Secondary | ICD-10-CM | POA: Diagnosis not present

## 2019-10-28 NOTE — Progress Notes (Signed)
Subjective:    Patient ID: Valerie Jacobs, female    DOB: 11-13-1960, 59 y.o.   MRN: [id number]  HPI  Pt presents to the clinic today with c/o swelling in her ankles. This started 1-2 weeks ago but has improved. She reports both of her ankles were swollen. She denies pain, redness or warmth. The swelling did not interfere with her gait. She is driving more and not sure if this is a contributing factor. She wore compression stockings, elevated, increased her water intake with c/o resolution of symptoms.  Review of Systems      Past Medical History:  Diagnosis Date  . Chicken pox   . Genital herpes     Current Outpatient Medications  Medication Sig Dispense Refill  . acetaminophen (TYLENOL) 325 MG tablet Take 650 mg by mouth as needed.    . Cholecalciferol (VITAMIN D) 2000 units CAPS Take 1 capsule by mouth daily.    . cyanocobalamin (,VITAMIN B-12,) 1000 MCG/ML injection Inject 1,000 mcg into the muscle every 30 (thirty) days. Patient is to receive injections every 2 weeks X 3 months (start 7/16), then take monthly X 3 months before rechecking.    . Iron-Folic Acid-Vit W54 (IRON FORMULA PO) Take 2 tablets by mouth daily.    . meloxicam (MOBIC) 15 MG tablet TAKE ONE TABLET BY MOUTH DAILY 30 tablet 0  . valACYclovir (VALTREX) 500 MG tablet TAKE ONE TABLET BY MOUTH TWO TIMES DAILY 30 tablet 0   No current facility-administered medications for this visit.    No Known Allergies  Family History  Problem Relation Age of Onset  . Cancer Father        Prostate  . Cancer Sister        Breast  . Hypertension Maternal Aunt   . Cancer Paternal Aunt        Breast  . Breast cancer Neg Hx     Social History   Socioeconomic History  . Marital status: Divorced    Spouse name: Not on file  . Number of children: Not on file  . Years of education: Not on file  . Highest education level: Not on file  Occupational History  . Not on file  Tobacco Use  . Smoking status: Never Smoker    . Smokeless tobacco: Never Used  Substance and Sexual Activity  . Alcohol use: Yes    Alcohol/week: 0.0 standard drinks    Comment: wine--occasional  . Drug use: No  . Sexual activity: Not Currently  Other Topics Concern  . Not on file  Social History Narrative  . Not on file   Social Determinants of Health   Financial Resource Strain:   . Difficulty of Paying Living Expenses:   Food Insecurity:   . Worried About Charity fundraiser in the Last Year:   . Arboriculturist in the Last Year:   Transportation Needs:   . Film/video editor (Medical):   Marland Kitchen Lack of Transportation (Non-Medical):   Physical Activity:   . Days of Exercise per Week:   . Minutes of Exercise per Session:   Stress:   . Feeling of Stress :   Social Connections:   . Frequency of Communication with Friends and Family:   . Frequency of Social Gatherings with Friends and Family:   . Attends Religious Services:   . Active Member of Clubs or Organizations:   . Attends Archivist Meetings:   Marland Kitchen Marital Status:   Intimate  Partner Violence:   . Fear of Current or Ex-Partner:   . Emotionally Abused:   Marland Kitchen Physically Abused:   . Sexually Abused:      Constitutional: Denies fever, malaise, fatigue, headache or abrupt weight changes.  Respiratory: Denies difficulty breathing, shortness of breath, cough or sputum production.   Cardiovascular: Pt reports swelling in feet. Denies chest pain, chest tightness, palpitations or swelling in the hands.  Musculoskeletal: Denies decrease in range of motion, difficulty with gait, muscle pain or joint pain and swelling.  Skin: Denies redness, rashes, lesions or ulcercations.     No other specific complaints in a complete review of systems (except as listed in HPI above).  Objective:   Physical Exam   BP (!) 152/84 (BP Location: Left Arm, Patient Position: Sitting, Cuff Size: Large)   Pulse 71   Temp (!) 97.3 F (36.3 C) (Other (Comment))   Ht 5\' 4"   (1.626 m)   Wt 288 lb (130.6 kg)   SpO2 98%   BMI 49.44 kg/m   Wt Readings from Last 3 Encounters:  09/03/19 290 lb (131.5 kg)  03/28/19 282 lb (127.9 kg)  11/29/18 286 lb (129.7 kg)    General: Appears her stated age, obese, in NAD. Skin: Warm, dry and intact. No rashes noted. Cardiovascular: Normal rate. No BLE edema noted today. Pulmonary/Chest: Normal effort.  Musculoskeletal:  No difficulty with gait.  Neurological: Alert and oriented.    BMET    Component Value Date/Time   NA 140 09/03/2019 1554   NA 143 08/08/2017 0820   K 4.6 09/03/2019 1554   CL 103 09/03/2019 1554   CO2 30 09/03/2019 1554   GLUCOSE 95 09/03/2019 1554   BUN 13 09/03/2019 1554   BUN 9 08/08/2017 0820   CREATININE 0.79 09/03/2019 1554   CALCIUM 9.1 09/03/2019 1554   CALCIUM 8.7 03/14/2007 0207   GFRNONAA 79 08/08/2017 0820   GFRAA 91 08/08/2017 0820    Lipid Panel     Component Value Date/Time   CHOL 183 09/03/2019 1554   CHOL 217 (H) 06/26/2018 0945   TRIG 58.0 09/03/2019 1554   HDL 69.60 09/03/2019 1554   HDL 80 06/26/2018 0945   CHOLHDL 3 09/03/2019 1554   VLDL 11.6 09/03/2019 1554   LDLCALC 101 (H) 09/03/2019 1554   LDLCALC 127 (H) 06/26/2018 0945    CBC    Component Value Date/Time   WBC 6.7 09/03/2019 1554   RBC 4.83 09/03/2019 1554   HGB 13.3 09/03/2019 1554   HGB 14.2 08/08/2017 0820   HCT 40.8 09/03/2019 1554   HCT 44.2 08/08/2017 0820   PLT 311.0 09/03/2019 1554   PLT 340 08/08/2017 0820   MCV 84.5 09/03/2019 1554   MCV 89 08/08/2017 0820   MCH 28.6 08/08/2017 0820   MCHC 32.7 09/03/2019 1554   RDW 13.7 09/03/2019 1554   RDW 14.9 08/08/2017 0820   LYMPHSABS 1.3 11/29/2018 0928   LYMPHSABS 1.5 08/08/2017 0820   MONOABS 0.5 11/29/2018 0928   EOSABS 0.0 11/29/2018 0928   EOSABS 0.1 08/08/2017 0820   BASOSABS 0.0 11/29/2018 0928   BASOSABS 0.0 08/08/2017 0820    Hgb A1C Lab Results  Component Value Date   HGBA1C 5.1 09/03/2019           Assessment &  Plan:   Peripheral Edema:  Resolved Continue current interventions If persist or worsens, consider diuretic Will monitor  Return precautions discussed  09/05/2019, NP This visit occurred during the SARS-CoV-2 public health  emergency.  Safety protocols were in place, including screening questions prior to the visit, additional usage of staff PPE, and extensive cleaning of exam room while observing appropriate contact time as indicated for disinfecting solutions.

## 2019-10-28 NOTE — Patient Instructions (Signed)
Peripheral Edema  Peripheral edema is swelling that is caused by a buildup of fluid. Peripheral edema most often affects the lower legs, ankles, and feet. It can also develop in the arms, hands, and face. The area of the body that has peripheral edema will look swollen. It may also feel heavy or warm. Your clothes may start to feel tight. Pressing on the area may make a temporary dent in your skin. You may not be able to move your swollen arm or leg as much as usual. There are many causes of peripheral edema. It can happen because of a complication of other conditions such as congestive heart failure, kidney disease, or a problem with your blood circulation. It also can be a side effect of certain medicines or because of an infection. It often happens to women during pregnancy. Sometimes, the cause is not known. Follow these instructions at home: Managing pain, stiffness, and swelling   Raise (elevate) your legs while you are sitting or lying down.  Move around often to prevent stiffness and to lessen swelling.  Do not sit or stand for long periods of time.  Wear support stockings as told by your health care provider. Medicines  Take over-the-counter and prescription medicines only as told by your health care provider.  Your health care provider may prescribe medicine to help your body get rid of excess water (diuretic). General instructions  Pay attention to any changes in your symptoms.  Follow instructions from your health care provider about limiting salt (sodium) in your diet. Sometimes, eating less salt may reduce swelling.  Moisturize skin daily to help prevent skin from cracking and draining.  Keep all follow-up visits as told by your health care provider. This is important. Contact a health care provider if you have:  A fever.  Edema that starts suddenly or is getting worse, especially if you are pregnant or have a medical condition.  Swelling in only one leg.  Increased  swelling, redness, or pain in one or both of your legs.  Drainage or sores at the area where you have edema. Get help right away if you:  Develop shortness of breath, especially when you are lying down.  Have pain in your chest or abdomen.  Feel weak.  Feel faint. Summary  Peripheral edema is swelling that is caused by a buildup of fluid. Peripheral edema most often affects the lower legs, ankles, and feet.  Move around often to prevent stiffness and to lessen swelling. Do not sit or stand for long periods of time.  Pay attention to any changes in your symptoms.  Contact a health care provider if you have edema that starts suddenly or is getting worse, especially if you are pregnant or have a medical condition.  Get help right away if you develop shortness of breath, especially when lying down. This information is not intended to replace advice given to you by your health care provider. Make sure you discuss any questions you have with your health care provider. Document Revised: 02/28/2018 Document Reviewed: 02/28/2018 [employer]vier Patient Education  2020 [employer]  

## 2020-04-16 ENCOUNTER — Other Ambulatory Visit: Payer: Self-pay | Admitting: Internal Medicine

## 2020-04-16 DIAGNOSIS — Z0000 Encounter for general adult medical examination without abnormal findings: Secondary | ICD-10-CM

## 2020-05-15 ENCOUNTER — Other Ambulatory Visit: Payer: Self-pay | Admitting: Internal Medicine

## 2020-05-15 DIAGNOSIS — Z0000 Encounter for general adult medical examination without abnormal findings: Secondary | ICD-10-CM

## 2020-12-24 ENCOUNTER — Other Ambulatory Visit: Payer: Self-pay | Admitting: Internal Medicine

## 2020-12-24 DIAGNOSIS — Z0000 Encounter for general adult medical examination without abnormal findings: Secondary | ICD-10-CM
